# Patient Record
Sex: Female | Born: 1990 | Race: Black or African American | Hispanic: No | Marital: Single | Smoking: Current every day smoker
Health system: Southern US, Community
[De-identification: ages and names within clinical notes are randomized; demographics above are authoritative.]

## PROBLEM LIST (undated history)

## (undated) ENCOUNTER — Inpatient Hospital Stay (HOSPITAL_COMMUNITY): Payer: Self-pay

## (undated) DIAGNOSIS — Z789 Other specified health status: Secondary | ICD-10-CM

## (undated) HISTORY — PX: DILATION AND CURETTAGE OF UTERUS: SHX78

---
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
## 2012-04-22 ENCOUNTER — Encounter (HOSPITAL_COMMUNITY): Payer: Self-pay | Admitting: Emergency Medicine

## 2012-04-22 ENCOUNTER — Emergency Department (INDEPENDENT_AMBULATORY_CARE_PROVIDER_SITE_OTHER): Payer: Self-pay

## 2012-04-22 ENCOUNTER — Emergency Department (INDEPENDENT_AMBULATORY_CARE_PROVIDER_SITE_OTHER)
Admission: EM | Admit: 2012-04-22 | Discharge: 2012-04-22 | Disposition: A | Discharge status: Home / Self Care | Payer: Self-pay | Source: Home / Self Care

## 2012-04-22 DIAGNOSIS — S9030XA Contusion of unspecified foot, initial encounter: Secondary | ICD-10-CM

## 2012-04-22 MED ORDER — IBUPROFEN 800 MG PO TABS: 800.0000 mg | ORAL_TABLET | Freq: Three times a day (TID) | ORAL | Status: DC

## 2012-04-22 NOTE — ED Notes (Signed)
Pt states that she injured her left foot yesterday evening by kicking the tire of a car. Pt states that pain has gradually gotten worse and is most painful when trying to walk.  Some swelling noted. Pt has not tried any meds/ice for relief.

## 2012-04-22 NOTE — ED Provider Notes (Signed)
History     CSN: [id number]  Arrival date & time 04/22/12  1218   First MD Initiated Contact with Patient 04/22/12 1335      Chief Complaint  Patient presents with  . Foot Injury    injury to left foot.     (Consider location/radiation/quality/duration/timing/severity/associated sxs/prior treatment) Patient is a 22 y.o. female presenting with foot injury. The history is provided by the patient. No language interpreter was used.  Foot Injury Location:  Foot Time since incident:  1 day Foot location:  L foot Pain details:    Quality:  Aching   Radiates to:  Does not radiate   Severity:  Moderate   Onset quality:  Gradual   Timing:  Constant Chronicity:  New Worsened by:  Activity and bearing weight Associated symptoms: decreased ROM   Pt kicked a tire yesterday.   Pt complains of swelling and pain  History reviewed. No pertinent past medical history.  History reviewed. No pertinent past surgical history.  History reviewed. No pertinent family history.  History  Substance Use Topics  . Smoking status: Current Every Day Smoker -- 0.50 packs/day    Types: Cigarettes  . Smokeless tobacco: Not on file  . Alcohol Use: Yes    OB History   Grav Para Term Preterm Abortions TAB SAB Ect Mult Living                  Review of Systems  Musculoskeletal: Positive for myalgias and joint swelling.  All other systems reviewed and are negative.    Allergies  Review of patient's allergies indicates no known allergies.  Home Medications  No current outpatient prescriptions on file.  BP 113/68  Pulse 64  Temp(Src) 99 F (37.2 C) (Oral)  Resp 20  SpO2 100%  LMP 03/25/2012  Physical Exam  Nursing note and vitals reviewed. Constitutional: She appears well-developed and well-nourished.  HENT:  Head: Normocephalic.  Musculoskeletal: She exhibits tenderness.  Swollen tender left mid foot,  From,  nv and ns intact  Neurological: She is alert.  Skin: Skin is warm.   Psychiatric: She has a normal mood and affect.    ED Course  Procedures (including critical care time)  Labs Reviewed  POCT PREGNANCY, URINE   Dg Foot Complete Left  04/22/2012  *RADIOLOGY REPORT*  Clinical Data: Injury 1 day ago.  Pain.  LEFT FOOT - COMPLETE 3+ VIEW  Comparison: None.  Findings: Imaged bones, joints and soft tissues appear normal.  IMPRESSION: Negative exam.   Original Report Authenticated By: Holley Dexter, M.D.      No diagnosis found.    MDM  Pt advised post op shoe,  Ice, rest,  Elevate.   Pt given ibuprofen for pain.          Lonia Skinner [place], PA-C 04/22/12 1454

## 2013-07-28 ENCOUNTER — Encounter (HOSPITAL_COMMUNITY): Payer: Self-pay | Admitting: Emergency Medicine

## 2013-07-28 ENCOUNTER — Emergency Department (HOSPITAL_COMMUNITY)
Admission: EM | Admit: 2013-07-28 | Discharge: 2013-07-28 | Disposition: A | Discharge status: Home / Self Care | Payer: Self-pay | Attending: Emergency Medicine | Admitting: Emergency Medicine

## 2013-07-28 DIAGNOSIS — Z3202 Encounter for pregnancy test, result negative: Secondary | ICD-10-CM | POA: Insufficient documentation

## 2013-07-28 DIAGNOSIS — R109 Unspecified abdominal pain: Secondary | ICD-10-CM | POA: Insufficient documentation

## 2013-07-28 DIAGNOSIS — F17200 Nicotine dependence, unspecified, uncomplicated: Secondary | ICD-10-CM | POA: Insufficient documentation

## 2013-07-28 DIAGNOSIS — R112 Nausea with vomiting, unspecified: Secondary | ICD-10-CM | POA: Insufficient documentation

## 2013-07-28 DIAGNOSIS — Z791 Long term (current) use of non-steroidal anti-inflammatories (NSAID): Secondary | ICD-10-CM | POA: Insufficient documentation

## 2013-07-28 DIAGNOSIS — R197 Diarrhea, unspecified: Secondary | ICD-10-CM | POA: Insufficient documentation

## 2013-07-28 LAB — CBC WITH DIFFERENTIAL/PLATELET
Basophils Absolute: 0 10*3/uL (ref 0.0–0.1)
Basophils Relative: 1 % (ref 0–1)
Eosinophils Absolute: 0.1 10*3/uL (ref 0.0–0.7)
Eosinophils Relative: 2 % (ref 0–5)
HCT: 42.4 % (ref 36.0–46.0)
Hemoglobin: 14.1 g/dL (ref 12.0–15.0)
LYMPHS ABS: 1.7 10*3/uL (ref 0.7–4.0)
LYMPHS PCT: 43 % (ref 12–46)
MCH: 30.6 pg (ref 26.0–34.0)
MCHC: 33.3 g/dL (ref 30.0–36.0)
MCV: 92 fL (ref 78.0–100.0)
Monocytes Absolute: 0.4 10*3/uL (ref 0.1–1.0)
Monocytes Relative: 10 % (ref 3–12)
NEUTROS ABS: 1.8 10*3/uL (ref 1.7–7.7)
NEUTROS PCT: 44 % (ref 43–77)
PLATELETS: 247 10*3/uL (ref 150–400)
RBC: 4.61 MIL/uL (ref 3.87–5.11)
RDW: 13.3 % (ref 11.5–15.5)
WBC: 4 10*3/uL (ref 4.0–10.5)

## 2013-07-28 LAB — COMPREHENSIVE METABOLIC PANEL
ALK PHOS: 66 U/L (ref 39–117)
ALT: 13 U/L (ref 0–35)
AST: 28 U/L (ref 0–37)
Albumin: 3.7 g/dL (ref 3.5–5.2)
BUN: 10 mg/dL (ref 6–23)
CO2: 25 meq/L (ref 19–32)
Calcium: 8.7 mg/dL (ref 8.4–10.5)
Chloride: 101 mEq/L (ref 96–112)
Creatinine, Ser: 1.17 mg/dL — ABNORMAL HIGH (ref 0.50–1.10)
GFR, EST AFRICAN AMERICAN: 74 mL/min — AB (ref >90)
GFR, EST NON AFRICAN AMERICAN: 64 mL/min — AB (ref >90)
GLUCOSE: 129 mg/dL — AB (ref 70–99)
POTASSIUM: 3.9 meq/L (ref 3.7–5.3)
SODIUM: 139 meq/L (ref 137–147)
Total Bilirubin: 0.4 mg/dL (ref 0.3–1.2)
Total Protein: 7.5 g/dL (ref 6.0–8.3)

## 2013-07-28 LAB — URINALYSIS, ROUTINE W REFLEX MICROSCOPIC
Bilirubin Urine: NEGATIVE
GLUCOSE, UA: NEGATIVE mg/dL
HGB URINE DIPSTICK: NEGATIVE
KETONES UR: NEGATIVE mg/dL
Leukocytes, UA: NEGATIVE
Nitrite: NEGATIVE
PH: 6 (ref 5.0–8.0)
PROTEIN: NEGATIVE mg/dL
Specific Gravity, Urine: 1.028 (ref 1.005–1.030)
Urobilinogen, UA: 1 mg/dL (ref 0.0–1.0)

## 2013-07-28 LAB — POC URINE PREG, ED: Preg Test, Ur: NEGATIVE

## 2013-07-28 LAB — LIPASE, BLOOD: Lipase: 31 U/L (ref 11–59)

## 2013-07-28 MED ORDER — SODIUM CHLORIDE 0.9 % IV BOLUS (SEPSIS)
1000.0000 mL | Freq: Once | INTRAVENOUS | Status: AC
  Administered 2013-07-28: 1000 mL via INTRAVENOUS

## 2013-07-28 MED ORDER — ONDANSETRON HCL 4 MG/2ML IJ SOLN
4.0000 mg | Freq: Once | INTRAMUSCULAR | Status: AC
  Administered 2013-07-28: 4 mg via INTRAVENOUS
  Filled 2013-07-28: qty 2

## 2013-07-28 MED ORDER — PROMETHAZINE HCL 25 MG PO TABS: 25.0000 mg | ORAL_TABLET | Freq: Four times a day (QID) | ORAL | Status: DC | PRN

## 2013-07-28 NOTE — ED Notes (Signed)
Patient declined wheelchair at discharge.  RN escorted patient to lobby.  

## 2013-07-28 NOTE — ED Provider Notes (Signed)
CSN: [id number]     Arrival date & time 07/28/13  1634 History   First MD Initiated Contact with Patient 07/28/13 2007     Chief Complaint  Patient presents with  . Emesis  . Diarrhea   HPI  History provided by the patient. Patient is a 23 year old female with no significant PMH presenting with complaints of nausea, vomiting and diarrhea. Patient first began feeling slightly nauseated with some vomiting last night. This continued with a least 5 episodes of vomiting today. She also reports loose watery diarrhea throughout the day without blood or mucus. She had no denies any recent travel. No known sick contacts. No associated fever, chills or sweats. She did not use any medicines for the treatment. No other aggravating or alleviating factors. No other associated symptoms.    History reviewed. No pertinent past medical history. History reviewed. No pertinent past surgical history. No family history on file. History  Substance Use Topics  . Smoking status: Current Every Day Smoker -- 0.50 packs/day    Types: Cigarettes  . Smokeless tobacco: Not on file  . Alcohol Use: Yes   OB History   Grav Para Term Preterm Abortions TAB SAB Ect Mult Living                 Review of Systems  Constitutional: Negative for fever, chills and diaphoresis.  Gastrointestinal: Positive for nausea, vomiting, abdominal pain and diarrhea. Negative for blood in stool.  Genitourinary: Negative for dysuria, frequency, hematuria and menstrual problem.  All other systems reviewed and are negative.     Allergies  Review of patient's allergies indicates no known allergies.  Home Medications   Prior to Admission medications   Medication Sig Start Date End Date Taking? Authorizing Provider  ibuprofen (ADVIL,MOTRIN) 800 MG tablet Take 1 tablet (800 mg total) by mouth 3 (three) times daily. 04/22/12   Elson AreasLeslie K Sofia, PA-C   BP 105/59  Pulse 64  Temp(Src) 98.4 F (36.9 C) (Oral)  Resp 16  SpO2 100%  LMP  06/28/2013 Physical Exam  Nursing note and vitals reviewed. Constitutional: She is oriented to person, place, and time. She appears well-developed and well-nourished. No distress.  HENT:  Head: Normocephalic and atraumatic.  Mouth/Throat: Oropharynx is clear and moist.  Cardiovascular: Normal rate and regular rhythm.   Pulmonary/Chest: Effort normal and breath sounds normal. No respiratory distress. She has no wheezes.  Abdominal: Soft. She exhibits no distension. There is no tenderness. There is no rebound and no guarding.  Musculoskeletal: Normal range of motion.  Neurological: She is alert and oriented to person, place, and time.  Skin: Skin is warm and dry. No rash noted.  Psychiatric: She has a normal mood and affect. Her behavior is normal.    ED Course  Procedures   COORDINATION OF CARE:  Nursing notes reviewed. Vital signs reviewed. Initial pt interview and examination performed.   Filed Vitals:   07/28/13 1708 07/28/13 1950 07/28/13 1951  BP: 109/81 105/59   Pulse: 74  64  Temp: 98.4 F (36.9 C)    TempSrc: Oral    Resp: 16    SpO2: 99%  100%    8:16 PM-patient seen and evaluated. Patient appears well in no acute distress. Does not appear severely ill or toxic.  Patient feeling better after IV fluids and Zofran. She is requesting to be discharged at this time. She is tolerating by mouth fluids.  Treatment plan initiated:Medications - No data to display   Results for orders  placed during the hospital encounter of 07/28/13  CBC WITH DIFFERENTIAL      Result Value Ref Range   WBC 4.0  4.0 - 10.5 K/uL   RBC 4.61  3.87 - 5.11 MIL/uL   Hemoglobin 14.1  12.0 - 15.0 g/dL   HCT 82.942.4  56.236.0 - 13.046.0 %   MCV 92.0  78.0 - 100.0 fL   MCH 30.6  26.0 - 34.0 pg   MCHC 33.3  30.0 - 36.0 g/dL   RDW 86.513.3  78.411.5 - 69.615.5 %   Platelets 247  150 - 400 K/uL   Neutrophils Relative % 44  43 - 77 %   Neutro Abs 1.8  1.7 - 7.7 K/uL   Lymphocytes Relative 43  12 - 46 %   Lymphs Abs 1.7   0.7 - 4.0 K/uL   Monocytes Relative 10  3 - 12 %   Monocytes Absolute 0.4  0.1 - 1.0 K/uL   Eosinophils Relative 2  0 - 5 %   Eosinophils Absolute 0.1  0.0 - 0.7 K/uL   Basophils Relative 1  0 - 1 %   Basophils Absolute 0.0  0.0 - 0.1 K/uL  COMPREHENSIVE METABOLIC PANEL      Result Value Ref Range   Sodium 139  137 - 147 mEq/L   Potassium 3.9  3.7 - 5.3 mEq/L   Chloride 101  96 - 112 mEq/L   CO2 25  19 - 32 mEq/L   Glucose, Bld 129 (*) 70 - 99 mg/dL   BUN 10  6 - 23 mg/dL   Creatinine, Ser 2.951.17 (*) 0.50 - 1.10 mg/dL   Calcium 8.7  8.4 - 28.410.5 mg/dL   Total Protein 7.5  6.0 - 8.3 g/dL   Albumin 3.7  3.5 - 5.2 g/dL   AST 28  0 - 37 U/L   ALT 13  0 - 35 U/L   Alkaline Phosphatase 66  39 - 117 U/L   Total Bilirubin 0.4  0.3 - 1.2 mg/dL   GFR calc non Af Amer 64 (*) >90 mL/min   GFR calc Af Amer 74 (*) >90 mL/min  LIPASE, BLOOD      Result Value Ref Range   Lipase 31  11 - 59 U/L  URINALYSIS, ROUTINE W REFLEX MICROSCOPIC      Result Value Ref Range   Color, Urine YELLOW  YELLOW   APPearance HAZY (*) CLEAR   Specific Gravity, Urine 1.028  1.005 - 1.030   pH 6.0  5.0 - 8.0   Glucose, UA NEGATIVE  NEGATIVE mg/dL   Hgb urine dipstick NEGATIVE  NEGATIVE   Bilirubin Urine NEGATIVE  NEGATIVE   Ketones, ur NEGATIVE  NEGATIVE mg/dL   Protein, ur NEGATIVE  NEGATIVE mg/dL   Urobilinogen, UA 1.0  0.0 - 1.0 mg/dL   Nitrite NEGATIVE  NEGATIVE   Leukocytes, UA NEGATIVE  NEGATIVE  POC URINE PREG, ED      Result Value Ref Range   Preg Test, Ur NEGATIVE  NEGATIVE      MDM   Final diagnoses:  Nausea vomiting and diarrhea        Angus Sellereter S Katessa Attridge, PA-C 07/28/13 2118

## 2013-07-28 NOTE — ED Notes (Signed)
C/o generalized abd pain since last night with nausea, vomiting, and diarrhea.  Reports vomited x 5 today.

## 2013-07-28 NOTE — Discharge Instructions (Signed)
Please followup with a primary care provider for continued evaluation and treatment. Drink plenty of fluids to stay hydrated. Wash your hands frequently to prevent any spread of infection. Return for any changing or worsening symptoms.    Viral Gastroenteritis Viral gastroenteritis is also known as stomach flu. This condition affects the stomach and intestinal tract. It can cause sudden diarrhea and vomiting. The illness typically lasts 3 to 8 days. Most people develop an immune response that eventually gets rid of the virus. While this natural response develops, the virus can make you quite ill. CAUSES  Many different viruses can cause gastroenteritis, such as rotavirus or noroviruses. You can catch one of these viruses by consuming contaminated food or water. You may also catch a virus by sharing utensils or other personal items with an infected person or by touching a contaminated surface. SYMPTOMS  The most common symptoms are diarrhea and vomiting. These problems can cause a severe loss of body fluids (dehydration) and a body salt (electrolyte) imbalance. Other symptoms may include:  Fever.  Headache.  Fatigue.  Abdominal pain. DIAGNOSIS  Your caregiver can usually diagnose viral gastroenteritis based on your symptoms and a physical exam. A stool sample may also be taken to test for the presence of viruses or other infections. TREATMENT  This illness typically goes away on its own. Treatments are aimed at rehydration. The most serious cases of viral gastroenteritis involve vomiting so severely that you are not able to keep fluids down. In these cases, fluids must be given through an intravenous line (IV). HOME CARE INSTRUCTIONS   Drink enough fluids to keep your urine clear or pale yellow. Drink small amounts of fluids frequently and increase the amounts as tolerated.  Ask your caregiver for specific rehydration instructions.  Avoid:  Foods high in sugar.  Alcohol.  Carbonated  drinks.  Tobacco.  Juice.  Caffeine drinks.  Extremely hot or cold fluids.  Fatty, greasy foods.  Too much intake of anything at one time.  Dairy products until 24 to 48 hours after diarrhea stops.  You may consume probiotics. Probiotics are active cultures of beneficial bacteria. They may lessen the amount and number of diarrheal stools in adults. Probiotics can be found in yogurt with active cultures and in supplements.  Wash your hands well to avoid spreading the virus.  Only take over-the-counter or prescription medicines for pain, discomfort, or fever as directed by your caregiver. Do not give aspirin to children. Antidiarrheal medicines are not recommended.  Ask your caregiver if you should continue to take your regular prescribed and over-the-counter medicines.  Keep all follow-up appointments as directed by your caregiver. SEEK IMMEDIATE MEDICAL CARE IF:   You are unable to keep fluids down.  You do not urinate at least once every 6 to 8 hours.  You develop shortness of breath.  You notice blood in your stool or vomit. This may look like coffee grounds.  You have abdominal pain that increases or is concentrated in one small area (localized).  You have persistent vomiting or diarrhea.  You have a fever.  The patient is a child younger than 3 months, and he or she has a fever.  The patient is a child older than 3 months, and he or she has a fever and persistent symptoms.  The patient is a child older than 3 months, and he or she has a fever and symptoms suddenly get worse.  The patient is a baby, and he or she has no tears  when crying. MAKE SURE YOU:   Understand these instructions.  Will watch your condition.  Will get help right away if you are not doing well or get worse. Document Released: 01/31/2005 Document Revised: 04/25/2011 Document Reviewed: 11/17/2010 [facility] Patient Information 2014 [employer].

## 2013-07-29 NOTE — ED Provider Notes (Signed)
Medical screening examination/treatment/procedure(s) were performed by non-physician practitioner and as supervising physician I was immediately available for consultation/collaboration.   EKG Interpretation None        Dreanna Kyllo M Chrishawn Boley, MD 07/29/13 0028 

## 2014-02-04 ENCOUNTER — Other Ambulatory Visit (HOSPITAL_COMMUNITY): Payer: Self-pay | Admitting: Nurse Practitioner

## 2014-02-04 DIAGNOSIS — Z369 Encounter for antenatal screening, unspecified: Secondary | ICD-10-CM

## 2014-02-04 LAB — OB RESULTS CONSOLE ABO/RH: RH TYPE: POSITIVE

## 2014-02-04 LAB — GLUCOSE TOLERANCE, 1 HOUR (50G) W/O FASTING: GLUCOSE 1 HOUR GTT: 112

## 2014-02-04 LAB — OB RESULTS CONSOLE GC/CHLAMYDIA
CHLAMYDIA, DNA PROBE: NEGATIVE
GC PROBE AMP, GENITAL: NEGATIVE

## 2014-02-04 LAB — OB RESULTS CONSOLE PLATELET COUNT: PLATELETS: 253 10*3/uL

## 2014-02-04 LAB — OB RESULTS CONSOLE RPR: RPR: NONREACTIVE

## 2014-02-04 LAB — SICKLE CELL SCREEN: Sickle Cell Screen: NEGATIVE

## 2014-02-04 LAB — CULTURE, OB URINE
PAP SMEAR: NEGATIVE
URINE CULTURE, OB: NEGATIVE

## 2014-02-04 LAB — OB RESULTS CONSOLE HGB/HCT, BLOOD
HEMATOCRIT: 34 %
Hemoglobin: 11 g/dL

## 2014-02-04 LAB — CYTOLOGY - PAP: CYTOLOGY - PAP: NEGATIVE

## 2014-02-04 LAB — CYSTIC FIBROSIS DIAGNOSTIC STUDY: INTERPRETATION-CFDNA: NEGATIVE

## 2014-02-04 LAB — OB RESULTS CONSOLE VARICELLA ZOSTER ANTIBODY, IGG: VARICELLA IGG: IMMUNE

## 2014-02-04 LAB — OB RESULTS CONSOLE ANTIBODY SCREEN: ANTIBODY SCREEN: NEGATIVE

## 2014-02-04 LAB — DRUG SCREEN, URINE: Drug Screen, Urine: POSITIVE

## 2014-02-11 ENCOUNTER — Encounter (HOSPITAL_COMMUNITY): Payer: Self-pay

## 2014-02-11 ENCOUNTER — Ambulatory Visit (HOSPITAL_COMMUNITY)
Admission: RE | Admit: 2014-02-11 | Discharge: 2014-02-11 | Disposition: A | Discharge status: Home / Self Care | Payer: Medicaid Other | Source: Ambulatory Visit | Attending: Nurse Practitioner | Admitting: Nurse Practitioner

## 2014-02-11 ENCOUNTER — Other Ambulatory Visit (HOSPITAL_COMMUNITY): Payer: Medicaid Other | Admitting: Nurse Practitioner

## 2014-02-11 ENCOUNTER — Other Ambulatory Visit (HOSPITAL_COMMUNITY): Payer: Self-pay | Admitting: Nurse Practitioner

## 2014-02-11 VITALS — BP 111/57 | HR 61 | Wt 156.0 lb

## 2014-02-11 DIAGNOSIS — Z36 Encounter for antenatal screening of mother: Secondary | ICD-10-CM | POA: Diagnosis present

## 2014-02-11 DIAGNOSIS — Z3A11 11 weeks gestation of pregnancy: Secondary | ICD-10-CM | POA: Diagnosis not present

## 2014-02-11 DIAGNOSIS — Z369 Encounter for antenatal screening, unspecified: Secondary | ICD-10-CM

## 2014-02-11 DIAGNOSIS — O09211 Supervision of pregnancy with history of pre-term labor, first trimester: Secondary | ICD-10-CM | POA: Insufficient documentation

## 2014-02-11 DIAGNOSIS — Z3490 Encounter for supervision of normal pregnancy, unspecified, unspecified trimester: Secondary | ICD-10-CM | POA: Insufficient documentation

## 2014-02-11 HISTORY — DX: Other specified health status: Z78.9

## 2014-02-11 LAB — OB RESULTS CONSOLE HEPATITIS B SURFACE ANTIGEN: Hepatitis B Surface Ag: NEGATIVE

## 2014-02-11 LAB — OB RESULTS CONSOLE HIV ANTIBODY (ROUTINE TESTING): HIV: NONREACTIVE

## 2014-02-11 LAB — OB RESULTS CONSOLE RUBELLA ANTIBODY, IGM: Rubella: IMMUNE

## 2014-02-13 ENCOUNTER — Ambulatory Visit (INDEPENDENT_AMBULATORY_CARE_PROVIDER_SITE_OTHER): Payer: Medicaid Other | Admitting: Family

## 2014-02-13 ENCOUNTER — Encounter: Payer: Self-pay | Admitting: Family

## 2014-02-13 VITALS — BP 108/65 | HR 69 | Temp 98.6°F | Ht 61.5 in | Wt 155.7 lb

## 2014-02-13 DIAGNOSIS — O09891 Supervision of other high risk pregnancies, first trimester: Secondary | ICD-10-CM | POA: Insufficient documentation

## 2014-02-13 DIAGNOSIS — Z23 Encounter for immunization: Secondary | ICD-10-CM

## 2014-02-13 DIAGNOSIS — O09291 Supervision of pregnancy with other poor reproductive or obstetric history, first trimester: Secondary | ICD-10-CM

## 2014-02-13 DIAGNOSIS — O34219 Maternal care for unspecified type scar from previous cesarean delivery: Secondary | ICD-10-CM

## 2014-02-13 DIAGNOSIS — F1290 Cannabis use, unspecified, uncomplicated: Secondary | ICD-10-CM

## 2014-02-13 DIAGNOSIS — F17210 Nicotine dependence, cigarettes, uncomplicated: Secondary | ICD-10-CM

## 2014-02-13 DIAGNOSIS — O3421 Maternal care for scar from previous cesarean delivery: Secondary | ICD-10-CM

## 2014-02-13 DIAGNOSIS — F1210 Cannabis abuse, uncomplicated: Secondary | ICD-10-CM

## 2014-02-13 DIAGNOSIS — O09211 Supervision of pregnancy with history of pre-term labor, first trimester: Secondary | ICD-10-CM

## 2014-02-13 DIAGNOSIS — Z720 Tobacco use: Secondary | ICD-10-CM

## 2014-02-13 DIAGNOSIS — O09299 Supervision of pregnancy with other poor reproductive or obstetric history, unspecified trimester: Secondary | ICD-10-CM | POA: Insufficient documentation

## 2014-02-13 LAB — POCT URINALYSIS DIP (DEVICE)
BILIRUBIN URINE: NEGATIVE
GLUCOSE, UA: NEGATIVE mg/dL
Hgb urine dipstick: NEGATIVE
Ketones, ur: NEGATIVE mg/dL
LEUKOCYTES UA: NEGATIVE
NITRITE: NEGATIVE
Protein, ur: NEGATIVE mg/dL
Specific Gravity, Urine: 1.015 (ref 1.005–1.030)
UROBILINOGEN UA: 0.2 mg/dL (ref 0.0–1.0)
pH: 7.5 (ref 5.0–8.0)

## 2014-02-13 NOTE — Patient Instructions (Signed)
First Trimester of Pregnancy The first trimester of pregnancy is from week 1 until the end of week 12 (months 1 through 3). A week after a sperm fertilizes an egg, the egg will implant on the wall of the uterus. This embryo will begin to develop into a baby. Genes from you and your partner are forming the baby. The female genes determine whether the baby is a boy or a girl. At 6-8 weeks, the eyes and face are formed, and the heartbeat can be seen on ultrasound. At the end of 12 weeks, all the baby's organs are formed.  Now that you are pregnant, you will want to do everything you can to have a healthy baby. Two of the most important things are to get good prenatal care and to follow your health care provider's instructions. Prenatal care is all the medical care you receive before the baby's birth. This care will help prevent, find, and treat any problems during the pregnancy and childbirth. BODY CHANGES Your body goes through many changes during pregnancy. The changes vary from woman to woman.   You may gain or lose a couple of pounds at first.  You may feel sick to your stomach (nauseous) and throw up (vomit). If the vomiting is uncontrollable, call your health care provider.  You may tire easily.  You may develop headaches that can be relieved by medicines approved by your health care provider.  You may urinate more often. Painful urination may mean you have a bladder infection.  You may develop heartburn as a result of your pregnancy.  You may develop constipation because certain hormones are causing the muscles that push waste through your intestines to slow down.  You may develop hemorrhoids or swollen, bulging veins (varicose veins).  Your breasts may begin to grow larger and become tender. Your nipples may stick out more, and the tissue that surrounds them (areola) may become darker.  Your gums may bleed and may be sensitive to brushing and flossing.  Dark spots or blotches (chloasma,  mask of pregnancy) may develop on your face. This will likely fade after the baby is born.  Your menstrual periods will stop.  You may have a loss of appetite.  You may develop cravings for certain kinds of food.  You may have changes in your emotions from day to day, such as being excited to be pregnant or being concerned that something may go wrong with the pregnancy and baby.  You may have more vivid and strange dreams.  You may have changes in your hair. These can include thickening of your hair, rapid growth, and changes in texture. Some women also have hair loss during or after pregnancy, or hair that feels dry or thin. Your hair will most likely return to normal after your baby is born. WHAT TO EXPECT AT YOUR PRENATAL VISITS During a routine prenatal visit:  You will be weighed to make sure you and the baby are growing normally.  Your blood pressure will be taken.  Your abdomen will be measured to track your baby's growth.  The fetal heartbeat will be listened to starting around week 10 or 12 of your pregnancy.  Test results from any previous visits will be discussed. Your health care provider may ask you:  How you are feeling.  If you are feeling the baby move.  If you have had any abnormal symptoms, such as leaking fluid, bleeding, severe headaches, or abdominal cramping.  If you have any questions. Other tests   that may be performed during your first trimester include:  Blood tests to find your blood type and to check for the presence of any previous infections. They will also be used to check for low iron levels (anemia) and Rh antibodies. Later in the pregnancy, blood tests for diabetes will be done along with other tests if problems develop.  Urine tests to check for infections, diabetes, or protein in the urine.  An ultrasound to confirm the proper growth and development of the baby.  An amniocentesis to check for possible genetic problems.  Fetal screens for  spina bifida and Down syndrome.  You may need other tests to make sure you and the baby are doing well. HOME CARE INSTRUCTIONS  Medicines  Follow your health care provider's instructions regarding medicine use. Specific medicines may be either safe or unsafe to take during pregnancy.  Take your prenatal vitamins as directed.  If you develop constipation, try taking a stool softener if your health care provider approves. Diet  Eat regular, well-balanced meals. Choose a variety of foods, such as meat or vegetable-based protein, fish, milk and low-fat dairy products, vegetables, fruits, and whole grain breads and cereals. Your health care provider will help you determine the amount of weight gain that is right for you.  Avoid raw meat and uncooked cheese. These carry germs that can cause birth defects in the baby.  Eating four or five small meals rather than three large meals a day may help relieve nausea and vomiting. If you start to feel nauseous, eating a few soda crackers can be helpful. Drinking liquids between meals instead of during meals also seems to help nausea and vomiting.  If you develop constipation, eat more high-fiber foods, such as fresh vegetables or fruit and whole grains. Drink enough fluids to keep your urine clear or pale yellow. Activity and Exercise  Exercise only as directed by your health care provider. Exercising will help you:  Control your weight.  Stay in shape.  Be prepared for labor and delivery.  Experiencing pain or cramping in the lower abdomen or low back is a good sign that you should stop exercising. Check with your health care provider before continuing normal exercises.  Try to avoid standing for long periods of time. Move your legs often if you must stand in one place for a long time.  Avoid heavy lifting.  Wear low-heeled shoes, and practice good posture.  You may continue to have sex unless your health care provider directs you  otherwise. Relief of Pain or Discomfort  Wear a good support bra for breast tenderness.   Take warm sitz baths to soothe any pain or discomfort caused by hemorrhoids. Use hemorrhoid cream if your health care provider approves.   Rest with your legs elevated if you have leg cramps or low back pain.  If you develop varicose veins in your legs, wear support hose. Elevate your feet for 15 minutes, 3-4 times a day. Limit salt in your diet. Prenatal Care  Schedule your prenatal visits by the twelfth week of pregnancy. They are usually scheduled monthly at first, then more often in the last 2 months before delivery.  Write down your questions. Take them to your prenatal visits.  Keep all your prenatal visits as directed by your health care provider. Safety  Wear your seat belt at all times when driving.  Make a list of emergency phone numbers, including numbers for family, friends, the hospital, and police and fire departments. General Tips    Ask your health care provider for a referral to a local prenatal education class. Begin classes no later than at the beginning of month 6 of your pregnancy.  Ask for help if you have counseling or nutritional needs during pregnancy. Your health care provider can offer advice or refer you to specialists for help with various needs.  Do not use hot tubs, steam rooms, or saunas.  Do not douche or use tampons or scented sanitary pads.  Do not cross your legs for long periods of time.  Avoid cat litter boxes and soil used by cats. These carry germs that can cause birth defects in the baby and possibly loss of the fetus by miscarriage or stillbirth.  Avoid all smoking, herbs, alcohol, and medicines not prescribed by your health care provider. Chemicals in these affect the formation and growth of the baby.  Schedule a dentist appointment. At home, brush your teeth with a soft toothbrush and be gentle when you floss. SEEK MEDICAL CARE IF:   You have  dizziness.  You have mild pelvic cramps, pelvic pressure, or nagging pain in the abdominal area.  You have persistent nausea, vomiting, or diarrhea.  You have a bad smelling vaginal discharge.  You have pain with urination.  You notice increased swelling in your face, hands, legs, or ankles. SEEK IMMEDIATE MEDICAL CARE IF:   You have a fever.  You are leaking fluid from your vagina.  You have spotting or bleeding from your vagina.  You have severe abdominal cramping or pain.  You have rapid weight gain or loss.  You vomit blood or material that looks like coffee grounds.  You are exposed to German measles and have never had them.  [place] are exposed to fifth disease or chickenpox.  You develop a severe headache.  You have shortness of breath.  You have any kind of trauma, such as from a fall or a car accident. Document Released: 01/25/2001 Document Revised: 06/17/2013 Document Reviewed: 12/11/2012 [facility] Patient Information 2015 [place], [place]. This information is not intended to replace advice given to you by your health care provider. Make sure you discuss any questions you have with your health care provider. Vaginal Birth After Cesarean Delivery Vaginal birth after cesarean delivery (VBAC) is giving birth vaginally after previously delivering a baby by a cesarean. In the past, if a woman had a cesarean delivery, all births afterward would be done by cesarean delivery. This is no longer true. It can be safe for the mother to try a vaginal delivery after having a cesarean delivery.  It is important to discuss VBAC with your health care provider early in the pregnancy so you can understand the risks, benefits, and options. It will give you time to decide what is best in your particular case. The final decision about whether to have a VBAC or repeat cesarean delivery should be between you and your health care provider. Any changes in your health or your baby's health during your  pregnancy may make it necessary to change your initial decision about VBAC.  WOMEN WHO PLAN TO HAVE A VBAC SHOULD CHECK WITH THEIR HEALTH CARE PROVIDER TO BE SURE THAT:  The previous cesarean delivery was done with a low transverse uterine cut (incision) (not a vertical classical incision).   The birth canal is big enough for the baby.   There were no other operations on the uterus.   An electronic fetal monitor (EFM) will be on at all times during labor.   An  operating room will be available and ready in case an emergency cesarean delivery is needed.   A health care provider and surgical nursing staff will be available at all times during labor to be ready to do an emergency delivery cesarean if necessary.   An anesthesiologist will be present in case an emergency cesarean delivery is needed.   The nursery is prepared and has adequate personnel and necessary equipment available to care for the baby in case of an emergency cesarean delivery. BENEFITS OF VBAC  Shorter stay in the hospital.   Avoidance of risks associated with cesarean delivery, such as:  Surgical complications, such as opening of the incision or hernia in the incision.  Injury to other organs.  Fever. This can occur if an infection develops after surgery. It can also occur as a reaction to the medicine given to make you numb during the surgery.  Less blood loss and need for blood transfusions.  Lower risk of blood clots and infection.  Shorter recovery.   Decreased risk for having to remove the uterus (hysterectomy).   Decreased risk for the placenta to completely or partially cover the opening of the uterus (placenta previa) with a future pregnancy.   Decrease risk in future labor and delivery. RISKS OF A VBAC  Tearing (rupture) of the uterus. This is occurs in less than 1% of VBACs. The risk of this happening is higher if:  Steps are taken to begin the labor process (induce labor) or  stimulate or strengthen contractions (augment labor).   Medicine is used to soften (ripen) the cervix.  Having to remove the uterus (hysterectomy) if it ruptures. VBAC SHOULD NOT BE DONE IF:  The previous cesarean delivery was done with a vertical (classical) or T-shaped incision or you do not know what kind of incision was made.   You had a ruptured uterus.   You have had certain types of surgery on your uterus, such as removal of uterine fibroids. Ask your health care provider about other types of surgeries that prevent you from having a VBAC.  You have certain medical or childbirth (obstetrical) problems.   There are problems with the baby.   You have had two previous cesarean deliveries and no vaginal deliveries. OTHER FACTS TO KNOW ABOUT VBAC:  It is safe to have an epidural anesthetic with VBAC.   It is safe to turn the baby from a breech position (attempt an external cephalic version).   It is safe to try a VBAC with twins.   VBAC may not be successful if your baby weights 8.8 lb (4 kg) or more. However, weight predictions are not always accurate and should not be used alone to decide if VBAC is right for you.  There is an increased failure rate if the time between the cesarean delivery and VBAC is less than 19 months.   Your health care provider may advise against a VBAC if you have preeclampsia (high blood pressure, protein in the urine, and swelling of face and extremities).   VBAC is often successful if you previously gave birth vaginally.   VBAC is often successful when the labor starts spontaneously before the due date.   Delivering a baby through a VBAC is similar to having a normal spontaneous vaginal delivery. Document Released: 07/24/2006 Document Revised: 06/17/2013 Document Reviewed: 08/30/2012 [facility] Patient Information 2015 [place]Care, [place]LL[place]. This information is not intended to replace advice given to you by your health care provider. Make sure  you discuss any questions  you have with your health care provider.

## 2014-02-13 NOTE — Progress Notes (Signed)
   Subjective:    Tami NorlanderViola Brocksmith is a [id number] 6134w2d being seen today for her first obstetrical visit.  [facility] to obstetrical history significant for IUFD at 24.5 wks preeceded by PPROM and LTCS of term infant in 2010.  Patient uncertain  regarding breastfeeding. Pregnancy history fully reviewed.  Patient reports no complaints.  Filed Vitals:   02/13/14 0932 02/13/14 0938  BP: 108/65   Pulse: 69   Temp: 98.6 F (37 C)   Height:  5' 1.5" (1.562 m)  Weight: 155 lb 11.2 oz (70.625 kg)     HISTORY: OB History  Gravida Para Term Preterm AB SAB TAB Ectopic Multiple Living  3 2 1 1      2     # Outcome Date GA Lbr Len/2nd Weight Sex Delivery Anes PTL Lv  3 Current           2 Term 03/18/08 7458w6d  7 lb (3.175 kg) M CS-LTranv  N Y  1 Preterm 06/03/06 526w5d  1 lb 3 oz (0.539 kg) F Vag-Spont  Y FD     Complications: Preterm premature rupture of membranes (PPROM) delivered, current hospitalization     Past Medical History  Diagnosis Date  . Medical history non-contributory    Past Surgical History  Procedure Laterality Date  . Dilation and curettage of uterus    . Cesarean section     Family History  Problem Relation Age of Onset  . Hypertension Father   . COPD Father   . Heart disease Brother      Exam   See Records from HD   Assessment:    Pregnancy:   23 yo G3P1102 at 734w2d wks IUP  Patient Active Problem List   Diagnosis Date Noted  . History of preterm delivery, currently pregnant in first trimester 02/13/2014  . History of intrauterine fetal death, currently pregnant 02/13/2014  . Cigarette smoker 02/13/2014  . Marijuana use 02/13/2014  . History of cesarean delivery, currently pregnant 02/13/2014  . [redacted] weeks gestation of pregnancy   . Pregnancy at early stage         Plan:     Initial labs reviewed. Prenatal vitamins. Problem list reviewed and updated. Pt counseled regarding csection versus TOLAC > pt opts for repeat  csection. 17-p application completed. Genetic Screening discussed Integrated Screen: scheduled for 02/25/13. Follow up in 3 weeks.   Marlis EdelsonKARIM, Willard Madrigal N 02/13/2014

## 2014-02-13 NOTE — Progress Notes (Signed)
Here for initial prenatal record- transferring [facility]. New patient information given. C/o lower back pain.

## 2014-02-13 NOTE — Progress Notes (Signed)
[facility] paperwork completed and faxed

## 2014-02-13 NOTE — Progress Notes (Signed)
Nutrition note: 1st visit consult Pt has lost 0.3# @ 3264w2d. Pt reports she had a lot of nausea in the beginning but it has improved. Pt reports eating 3 meals & 2-4 snacks/d. Pt is taking a PNV. Pt reports no heartburn. Pt received verbal & written education on general nutrition during pregnancy. Encouraged protein with all meals & snacks. Discussed wt gain goals of 15-25# or 0.6#/wk. Pt agrees to continue taking her PNV. Pt does not have WIC but plans to apply. Pt does not plan to BF. F/u as needed Blondell RevealLaura Jay Haskew, MS, RD, LDN, [facility] HealthcareBCLC

## 2014-02-17 ENCOUNTER — Encounter: Payer: Self-pay | Admitting: *Deleted

## 2014-02-21 ENCOUNTER — Encounter: Payer: Self-pay | Admitting: *Deleted

## 2014-02-25 ENCOUNTER — Encounter (HOSPITAL_COMMUNITY): Payer: Self-pay

## 2014-02-25 ENCOUNTER — Ambulatory Visit (HOSPITAL_COMMUNITY)
Admission: RE | Admit: 2014-02-25 | Discharge: 2014-02-25 | Disposition: A | Discharge status: Home / Self Care | Payer: Medicaid Other | Source: Ambulatory Visit | Attending: Obstetrics & Gynecology | Admitting: Obstetrics & Gynecology

## 2014-02-25 DIAGNOSIS — Z3A13 13 weeks gestation of pregnancy: Secondary | ICD-10-CM | POA: Insufficient documentation

## 2014-02-25 DIAGNOSIS — Z369 Encounter for antenatal screening, unspecified: Secondary | ICD-10-CM

## 2014-02-25 DIAGNOSIS — Z36 Encounter for antenatal screening of mother: Secondary | ICD-10-CM | POA: Diagnosis present

## 2014-02-25 DIAGNOSIS — Z3682 Encounter for antenatal screening for nuchal translucency: Secondary | ICD-10-CM | POA: Insufficient documentation

## 2014-02-25 LAB — SCREEN, FIRST TRIMESTER, SERUM

## 2014-02-26 ENCOUNTER — Telehealth: Payer: Self-pay | Admitting: *Deleted

## 2014-02-26 ENCOUNTER — Encounter: Payer: Self-pay | Admitting: *Deleted

## 2014-02-26 NOTE — Telephone Encounter (Signed)
Pt called and stated that she is experiencing some light bleeding. She reports that she did have intercourse this morning. I advised that this is most likely due to the intercourse and that if bleeding became heavy or she had pain to call us back. Patient is agreeable to this.

## 2014-02-26 NOTE — Progress Notes (Signed)
[facility]  [facility] and will be triaged to the pharmacy when the patient is 15 w 0 days gestation.

## 2014-03-04 ENCOUNTER — Other Ambulatory Visit (HOSPITAL_COMMUNITY): Payer: Self-pay

## 2014-03-06 ENCOUNTER — Ambulatory Visit (INDEPENDENT_AMBULATORY_CARE_PROVIDER_SITE_OTHER): Payer: Medicaid Other | Admitting: Family Medicine

## 2014-03-06 ENCOUNTER — Encounter: Payer: Medicaid Other | Admitting: Family Medicine

## 2014-03-06 ENCOUNTER — Telehealth: Payer: Self-pay | Admitting: *Deleted

## 2014-03-06 VITALS — BP 108/52 | HR 78 | Temp 98.1°F | Wt 160.7 lb

## 2014-03-06 DIAGNOSIS — O09891 Supervision of other high risk pregnancies, first trimester: Secondary | ICD-10-CM

## 2014-03-06 DIAGNOSIS — O09211 Supervision of pregnancy with history of pre-term labor, first trimester: Secondary | ICD-10-CM

## 2014-03-06 LAB — POCT URINALYSIS DIP (DEVICE)
BILIRUBIN URINE: NEGATIVE
GLUCOSE, UA: NEGATIVE mg/dL
Hgb urine dipstick: NEGATIVE
Ketones, ur: NEGATIVE mg/dL
Nitrite: NEGATIVE
PH: 7 (ref 5.0–8.0)
Protein, ur: NEGATIVE mg/dL
Specific Gravity, Urine: 1.025 (ref 1.005–1.030)
UROBILINOGEN UA: 1 mg/dL (ref 0.0–1.0)

## 2014-03-06 NOTE — Addendum Note (Signed)
Addended by: Fredirick LatheACOSTA, Verle Brillhart on: 03/06/2014 11:55 AM   Modules accepted: Level of Service

## 2014-03-06 NOTE — Telephone Encounter (Signed)
[facility] that she doesn't need to come to her appointment today because she is too early to start the shot. I advised patient that the appointment today is for a prenatal visit. We will start her shot in 2 weeks. Patient is agreeable to this, stated that she will be here at 11.

## 2014-03-06 NOTE — Patient Instructions (Signed)
Vaginal Bleeding During Pregnancy, Second Trimester °A small amount of bleeding (spotting) from the vagina is relatively common in pregnancy. It usually stops on its own. Various things can cause bleeding or spotting in pregnancy. Some bleeding may be related to the pregnancy, and some may not. Sometimes the bleeding is normal and is not a problem. However, bleeding can also be a sign of something serious. Be sure to tell your health care provider about any vaginal bleeding right away. °Some possible causes of vaginal bleeding during the second trimester include: °· Infection, inflammation, or growths on the cervix.   °· The placenta may be partially or completely covering the opening of the cervix inside the uterus (placenta previa). °· The placenta may have separated from the uterus (abruption of the placenta).   °· You may be having early (preterm) labor.   °· The cervix may not be strong enough to keep a baby inside the uterus (cervical insufficiency).   °· Tiny cysts may have developed in the uterus instead of pregnancy tissue (molar pregnancy).  °HOME CARE INSTRUCTIONS  °Watch your condition for any changes. The following actions may help to lessen any discomfort you are feeling: °· Follow your health care provider's instructions for limiting your activity. If your health care provider orders bed rest, you may need to stay in bed and only get up to use the bathroom. However, your health care provider may allow you to continue light activity. °· If needed, make plans for someone to help with your regular activities and responsibilities while you are on bed rest. °· Keep track of the number of pads you use each day, how often you change pads, and how soaked (saturated) they are. Write this down. °· Do not use tampons. Do not douche. °· Do not have sexual intercourse or orgasms until approved by your health care provider. °· If you pass any tissue from your vagina, save the tissue so you can show it to your  health care provider. °· Only take over-the-counter or prescription medicines as directed by your health care provider. °· Do not take aspirin because it can make you bleed. °· Do not exercise or perform any strenuous activities or heavy lifting without your health care provider's permission. °· Keep all follow-up appointments as directed by your health care provider. °SEEK MEDICAL CARE IF: °· You have any vaginal bleeding during any part of your pregnancy. °· You have cramps or labor pains. °· You have a fever, not controlled by medicine. °SEEK IMMEDIATE MEDICAL CARE IF:  °· You have severe cramps in your back or belly (abdomen). °· You have contractions. °· You have chills. °· You pass large clots or tissue from your vagina. °· Your bleeding increases. °· You feel light-headed or weak, or you have fainting episodes. °· You are leaking fluid or have a gush of fluid from your vagina. °MAKE SURE YOU: °· Understand these instructions. °· Will watch your condition. °· Will get help right away if you are not doing well or get worse. °Document Released: 11/10/2004 Document Revised: 02/05/2013 Document Reviewed: 10/08/2012 °ExitCare® Patient Information ©2015 Exit[employer], LLC. This information is not intended to replace advice given to you by your health care provider. Make sure you discuss any questions you have with your health care provider. ° °

## 2014-03-06 NOTE — Progress Notes (Signed)
Patient is 24 y.o. [id number] 4465w2d.  Some back pain, worse at work or when has long day.  Had some vaginal spotting after intercourse. - discussed vaginal bleeding, precautions discussed - increase water intake, tylenol prn back pain. - 17p to be started at next visit, anatomy sono ordered

## 2014-03-11 ENCOUNTER — Encounter: Payer: Self-pay | Admitting: *Deleted

## 2014-03-20 ENCOUNTER — Ambulatory Visit (INDEPENDENT_AMBULATORY_CARE_PROVIDER_SITE_OTHER): Payer: Medicaid Other | Admitting: *Deleted

## 2014-03-20 VITALS — BP 108/65 | HR 79

## 2014-03-20 DIAGNOSIS — O09892 Supervision of other high risk pregnancies, second trimester: Secondary | ICD-10-CM

## 2014-03-20 DIAGNOSIS — O09212 Supervision of pregnancy with history of pre-term labor, second trimester: Secondary | ICD-10-CM

## 2014-03-20 MED ORDER — HYDROXYPROGESTERONE CAPROATE 250 MG/ML IM OIL
250.0000 mg | TOPICAL_OIL | Freq: Once | INTRAMUSCULAR | Status: AC
  Administered 2014-03-20: 250 mg via INTRAMUSCULAR

## 2014-03-20 NOTE — Progress Notes (Signed)
Pt here for first dose of 17P ([facility]).  Injection given IM to RUOQ.  Pt tolerated well.

## 2014-03-27 ENCOUNTER — Ambulatory Visit: Payer: Medicaid Other

## 2014-03-28 ENCOUNTER — Ambulatory Visit (INDEPENDENT_AMBULATORY_CARE_PROVIDER_SITE_OTHER): Payer: Medicaid Other | Admitting: *Deleted

## 2014-03-28 VITALS — BP 116/67 | HR 75 | Wt 166.0 lb

## 2014-03-28 DIAGNOSIS — O09892 Supervision of other high risk pregnancies, second trimester: Secondary | ICD-10-CM

## 2014-03-28 DIAGNOSIS — O09212 Supervision of pregnancy with history of pre-term labor, second trimester: Secondary | ICD-10-CM

## 2014-03-28 MED ORDER — HYDROXYPROGESTERONE CAPROATE 250 MG/ML IM OIL
250.0000 mg | TOPICAL_OIL | Freq: Once | INTRAMUSCULAR | Status: AC
  Administered 2014-03-28: 250 mg via INTRAMUSCULAR

## 2014-04-03 ENCOUNTER — Ambulatory Visit (INDEPENDENT_AMBULATORY_CARE_PROVIDER_SITE_OTHER): Payer: Medicaid Other | Admitting: Obstetrics & Gynecology

## 2014-04-03 VITALS — BP 108/62 | HR 84 | Temp 97.9°F | Wt 169.9 lb

## 2014-04-03 DIAGNOSIS — O09212 Supervision of pregnancy with history of pre-term labor, second trimester: Secondary | ICD-10-CM

## 2014-04-03 LAB — POCT URINALYSIS DIP (DEVICE)
BILIRUBIN URINE: NEGATIVE
Glucose, UA: NEGATIVE mg/dL
Hgb urine dipstick: NEGATIVE
Ketones, ur: NEGATIVE mg/dL
Nitrite: NEGATIVE
Protein, ur: NEGATIVE mg/dL
SPECIFIC GRAVITY, URINE: 1.015 (ref 1.005–1.030)
Urobilinogen, UA: 0.2 mg/dL (ref 0.0–1.0)
pH: 8.5 — ABNORMAL HIGH (ref 5.0–8.0)

## 2014-04-03 MED ORDER — HYDROXYPROGESTERONE CAPROATE 250 MG/ML IM OIL
250.0000 mg | TOPICAL_OIL | INTRAMUSCULAR | Status: AC
  Administered 2014-04-03 – 2014-07-31 (×18): 250 mg via INTRAMUSCULAR

## 2014-04-03 NOTE — Progress Notes (Signed)
Pt could not stay for appointment, 17P injection given.  Pt to call and reschedule appointment

## 2014-04-03 NOTE — Progress Notes (Signed)
C/o of intermittent lower pack pain.

## 2014-04-10 ENCOUNTER — Other Ambulatory Visit: Payer: Self-pay | Admitting: Family Medicine

## 2014-04-10 ENCOUNTER — Ambulatory Visit (INDEPENDENT_AMBULATORY_CARE_PROVIDER_SITE_OTHER): Payer: Medicaid Other | Admitting: Family Medicine

## 2014-04-10 ENCOUNTER — Ambulatory Visit (HOSPITAL_COMMUNITY)
Admission: RE | Admit: 2014-04-10 | Discharge: 2014-04-10 | Disposition: A | Discharge status: Home / Self Care | Payer: Medicaid Other | Source: Ambulatory Visit | Attending: Family Medicine | Admitting: Family Medicine

## 2014-04-10 VITALS — BP 122/72 | HR 96 | Temp 98.2°F | Wt 171.8 lb

## 2014-04-10 DIAGNOSIS — O09212 Supervision of pregnancy with history of pre-term labor, second trimester: Secondary | ICD-10-CM | POA: Diagnosis present

## 2014-04-10 DIAGNOSIS — Z3A19 19 weeks gestation of pregnancy: Secondary | ICD-10-CM | POA: Insufficient documentation

## 2014-04-10 DIAGNOSIS — O09219 Supervision of pregnancy with history of pre-term labor, unspecified trimester: Secondary | ICD-10-CM | POA: Insufficient documentation

## 2014-04-10 DIAGNOSIS — Z3689 Encounter for other specified antenatal screening: Secondary | ICD-10-CM | POA: Insufficient documentation

## 2014-04-10 DIAGNOSIS — O09291 Supervision of pregnancy with other poor reproductive or obstetric history, first trimester: Secondary | ICD-10-CM

## 2014-04-10 DIAGNOSIS — O09299 Supervision of pregnancy with other poor reproductive or obstetric history, unspecified trimester: Secondary | ICD-10-CM | POA: Insufficient documentation

## 2014-04-10 LAB — POCT URINALYSIS DIP (DEVICE)
BILIRUBIN URINE: NEGATIVE
Glucose, UA: NEGATIVE mg/dL
HGB URINE DIPSTICK: NEGATIVE
Ketones, ur: NEGATIVE mg/dL
Leukocytes, UA: NEGATIVE
Nitrite: NEGATIVE
PH: 8 (ref 5.0–8.0)
Protein, ur: NEGATIVE mg/dL
Specific Gravity, Urine: 1.015 (ref 1.005–1.030)
Urobilinogen, UA: 0.2 mg/dL (ref 0.0–1.0)

## 2014-04-10 NOTE — Progress Notes (Signed)
Pt has received medicaid card, [facility] will contact pt with dental appt.

## 2014-04-10 NOTE — Progress Notes (Signed)
1ml 17P administered into RUO quadrant buttocks. Patient tolerated well. Vial empty. [facility] at [phone] and spoke to tech requesting refill-- reported it will be refilled and sent to clinic.

## 2014-04-10 NOTE — Progress Notes (Signed)
Patient is 24 y.o. [id number] 5813w2d.  +FM, denies LOF, VB, contractions, vaginal discharge.  Overall feeling well. - MSAFP ordered today

## 2014-04-10 NOTE — Progress Notes (Signed)
Needs refill on PNV. 

## 2014-04-17 ENCOUNTER — Ambulatory Visit (INDEPENDENT_AMBULATORY_CARE_PROVIDER_SITE_OTHER): Payer: Medicaid Other

## 2014-04-17 DIAGNOSIS — O09212 Supervision of pregnancy with history of pre-term labor, second trimester: Secondary | ICD-10-CM

## 2014-04-17 MED ORDER — PNV PRENATAL PLUS MULTIVITAMIN 27-1 MG PO TABS: 1.0000 | ORAL_TABLET | Freq: Every day | ORAL | Status: DC

## 2014-04-17 NOTE — Progress Notes (Signed)
Patient here today for weekly 17P injection and AFP lab draw. 1ml 17P adminstered into LUO quadrant of buttocks. Patient tolerated well. Patient requests refill of PNV-- sent to pharmacy. AFP drawn. Patient to return in 1 week.

## 2014-04-18 LAB — ALPHA FETOPROTEIN, MATERNAL
AFP: 38 ng/mL
CURR GEST AGE: 20.2 wks.days
MoM for AFP: 0.62
OPEN SPINA BIFIDA: NEGATIVE

## 2014-04-24 ENCOUNTER — Ambulatory Visit (INDEPENDENT_AMBULATORY_CARE_PROVIDER_SITE_OTHER): Payer: Medicaid Other

## 2014-04-24 VITALS — BP 106/55 | HR 77 | Temp 98.5°F | Wt 175.4 lb

## 2014-04-24 DIAGNOSIS — O09891 Supervision of other high risk pregnancies, first trimester: Secondary | ICD-10-CM

## 2014-04-24 DIAGNOSIS — O09211 Supervision of pregnancy with history of pre-term labor, first trimester: Secondary | ICD-10-CM

## 2014-04-24 NOTE — Progress Notes (Signed)
Patient here today for weekly injection of 17P. 1ml 17P administered into LUO quadrant of buttocks. Patient tolerated well. Denies any questions, concerns or problems to report. To return in one week for next injection.

## 2014-05-01 ENCOUNTER — Ambulatory Visit (INDEPENDENT_AMBULATORY_CARE_PROVIDER_SITE_OTHER): Payer: Medicaid Other

## 2014-05-01 ENCOUNTER — Encounter: Payer: Self-pay | Admitting: Family Medicine

## 2014-05-01 DIAGNOSIS — O09211 Supervision of pregnancy with history of pre-term labor, first trimester: Secondary | ICD-10-CM

## 2014-05-01 DIAGNOSIS — O09891 Supervision of other high risk pregnancies, first trimester: Secondary | ICD-10-CM

## 2014-05-01 NOTE — Progress Notes (Signed)
Patient here today for weekly injection of 17P. 1ml 17P administered into LUO quadrant of buttocks. Patient tolerated well. Patient reports being told she would need a follow up ultrasound-- per imaging reports recommend follow up with M[facility] to complete anatomy and check bowel, also recommend cervical length check-- order placed and appointment to be scheduled by Edwyna PerfectBrooke Gilliam, RN and patient informed.

## 2014-05-01 NOTE — Progress Notes (Signed)
U/S to check bowel, cervical length, followup anatomy 05/09/14 @  915a with [facility] (no appts avail 05/08/14).

## 2014-05-08 ENCOUNTER — Encounter: Payer: Self-pay | Admitting: Obstetrics and Gynecology

## 2014-05-08 ENCOUNTER — Ambulatory Visit (INDEPENDENT_AMBULATORY_CARE_PROVIDER_SITE_OTHER): Payer: Medicaid Other | Admitting: Obstetrics and Gynecology

## 2014-05-08 VITALS — BP 101/50 | HR 72 | Temp 98.5°F | Wt 178.2 lb

## 2014-05-08 DIAGNOSIS — O09211 Supervision of pregnancy with history of pre-term labor, first trimester: Secondary | ICD-10-CM

## 2014-05-08 DIAGNOSIS — O09292 Supervision of pregnancy with other poor reproductive or obstetric history, second trimester: Secondary | ICD-10-CM

## 2014-05-08 DIAGNOSIS — O3421 Maternal care for scar from previous cesarean delivery: Secondary | ICD-10-CM

## 2014-05-08 DIAGNOSIS — O09891 Supervision of other high risk pregnancies, first trimester: Secondary | ICD-10-CM

## 2014-05-08 DIAGNOSIS — O09212 Supervision of pregnancy with history of pre-term labor, second trimester: Secondary | ICD-10-CM

## 2014-05-08 DIAGNOSIS — O34219 Maternal care for unspecified type scar from previous cesarean delivery: Secondary | ICD-10-CM

## 2014-05-08 DIAGNOSIS — F1290 Cannabis use, unspecified, uncomplicated: Secondary | ICD-10-CM

## 2014-05-08 LAB — POCT URINALYSIS DIP (DEVICE)
BILIRUBIN URINE: NEGATIVE
Glucose, UA: NEGATIVE mg/dL
Hgb urine dipstick: NEGATIVE
Ketones, ur: NEGATIVE mg/dL
Nitrite: NEGATIVE
Protein, ur: NEGATIVE mg/dL
Specific Gravity, Urine: 1.02 (ref 1.005–1.030)
Urobilinogen, UA: 0.2 mg/dL (ref 0.0–1.0)
pH: 8.5 — ABNORMAL HIGH (ref 5.0–8.0)

## 2014-05-08 NOTE — Progress Notes (Signed)
Patient is doing well without complaints. Follow up anatomy ultrasound tomorrow. Continue weekly 17-P. 1hr GCT and labs next visit

## 2014-05-09 ENCOUNTER — Ambulatory Visit (HOSPITAL_COMMUNITY)
Admission: RE | Admit: 2014-05-09 | Discharge: 2014-05-09 | Disposition: A | Discharge status: Home / Self Care | Payer: Medicaid Other | Source: Ambulatory Visit | Attending: Obstetrics and Gynecology | Admitting: Obstetrics and Gynecology

## 2014-05-09 ENCOUNTER — Encounter (HOSPITAL_COMMUNITY): Payer: Self-pay

## 2014-05-09 ENCOUNTER — Other Ambulatory Visit: Payer: Self-pay | Admitting: Family Medicine

## 2014-05-09 ENCOUNTER — Inpatient Hospital Stay (HOSPITAL_COMMUNITY)
Admission: AD | Admit: 2014-05-09 | Discharge: 2014-05-12 | DRG: 781 | Disposition: A | Discharge status: Home / Self Care | Payer: Medicaid Other | Source: Ambulatory Visit | Attending: Family Medicine | Admitting: Family Medicine

## 2014-05-09 DIAGNOSIS — F17210 Nicotine dependence, cigarettes, uncomplicated: Secondary | ICD-10-CM | POA: Diagnosis present

## 2014-05-09 DIAGNOSIS — O99322 Drug use complicating pregnancy, second trimester: Secondary | ICD-10-CM | POA: Diagnosis present

## 2014-05-09 DIAGNOSIS — O99332 Smoking (tobacco) complicating pregnancy, second trimester: Secondary | ICD-10-CM | POA: Diagnosis present

## 2014-05-09 DIAGNOSIS — O09211 Supervision of pregnancy with history of pre-term labor, first trimester: Principal | ICD-10-CM

## 2014-05-09 DIAGNOSIS — Z0489 Encounter for examination and observation for other specified reasons: Secondary | ICD-10-CM | POA: Insufficient documentation

## 2014-05-09 DIAGNOSIS — O09292 Supervision of pregnancy with other poor reproductive or obstetric history, second trimester: Secondary | ICD-10-CM | POA: Diagnosis not present

## 2014-05-09 DIAGNOSIS — Z3A23 23 weeks gestation of pregnancy: Secondary | ICD-10-CM | POA: Insufficient documentation

## 2014-05-09 DIAGNOSIS — O09212 Supervision of pregnancy with history of pre-term labor, second trimester: Secondary | ICD-10-CM

## 2014-05-09 DIAGNOSIS — O3421 Maternal care for scar from previous cesarean delivery: Secondary | ICD-10-CM | POA: Diagnosis present

## 2014-05-09 DIAGNOSIS — O3430 Maternal care for cervical incompetence, unspecified trimester: Secondary | ICD-10-CM | POA: Insufficient documentation

## 2014-05-09 DIAGNOSIS — IMO0002 Reserved for concepts with insufficient information to code with codable children: Secondary | ICD-10-CM | POA: Insufficient documentation

## 2014-05-09 DIAGNOSIS — O26872 Cervical shortening, second trimester: Principal | ICD-10-CM | POA: Diagnosis present

## 2014-05-09 DIAGNOSIS — F1290 Cannabis use, unspecified, uncomplicated: Secondary | ICD-10-CM | POA: Diagnosis present

## 2014-05-09 DIAGNOSIS — O09891 Supervision of other high risk pregnancies, first trimester: Secondary | ICD-10-CM

## 2014-05-09 DIAGNOSIS — Z825 Family history of asthma and other chronic lower respiratory diseases: Secondary | ICD-10-CM | POA: Diagnosis not present

## 2014-05-09 DIAGNOSIS — Z8249 Family history of ischemic heart disease and other diseases of the circulatory system: Secondary | ICD-10-CM

## 2014-05-09 LAB — CBC
HCT: 31.1 % — ABNORMAL LOW (ref 36.0–46.0)
Hemoglobin: 10.6 g/dL — ABNORMAL LOW (ref 12.0–15.0)
MCH: 30.9 pg (ref 26.0–34.0)
MCHC: 34.1 g/dL (ref 30.0–36.0)
MCV: 90.7 fL (ref 78.0–100.0)
Platelets: 207 10*3/uL (ref 150–400)
RBC: 3.43 MIL/uL — AB (ref 3.87–5.11)
RDW: 13.4 % (ref 11.5–15.5)
WBC: 8.1 10*3/uL (ref 4.0–10.5)

## 2014-05-09 LAB — TYPE AND SCREEN
ABO/RH(D): A POS
Antibody Screen: NEGATIVE

## 2014-05-09 LAB — ABO/RH: ABO/RH(D): A POS

## 2014-05-09 MED ORDER — ZOLPIDEM TARTRATE 5 MG PO TABS
5.0000 mg | ORAL_TABLET | Freq: Every evening | ORAL | Status: DC | PRN
Start: 2014-05-09 — End: 2014-05-12

## 2014-05-09 MED ORDER — HYDROXYPROGESTERONE CAPROATE 250 MG/ML IM OIL: 250.0000 mg | TOPICAL_OIL | INTRAMUSCULAR | Status: DC

## 2014-05-09 MED ORDER — CALCIUM CARBONATE ANTACID 500 MG PO CHEW: 2.0000 | CHEWABLE_TABLET | ORAL | Status: DC | PRN

## 2014-05-09 MED ORDER — PRENATAL MULTIVITAMIN CH: 1.0000 | ORAL_TABLET | Freq: Every day | ORAL | Status: DC

## 2014-05-09 MED ORDER — ACETAMINOPHEN 325 MG PO TABS: 650.0000 mg | ORAL_TABLET | ORAL | Status: DC | PRN

## 2014-05-09 MED ORDER — DOCUSATE SODIUM 100 MG PO CAPS
100.0000 mg | ORAL_CAPSULE | Freq: Every day | ORAL | Status: DC
  Administered 2014-05-09 – 2014-05-12 (×4): 100 mg via ORAL
  Filled 2014-05-09 (×4): qty 1

## 2014-05-09 MED ORDER — PRENATAL PLUS 27-1 MG PO TABS
1.0000 | ORAL_TABLET | Freq: Every day | ORAL | Status: DC
  Administered 2014-05-09 – 2014-05-12 (×4): 1 via ORAL
  Filled 2014-05-09 (×4): qty 1

## 2014-05-09 MED ORDER — PROGESTERONE MICRONIZED 200 MG PO CAPS
200.0000 mg | ORAL_CAPSULE | Freq: Every day | ORAL | Status: DC
  Administered 2014-05-09 – 2014-05-11 (×3): 200 mg via VAGINAL
  Filled 2014-05-09 (×3): qty 1

## 2014-05-09 MED ORDER — ACETAMINOPHEN-CODEINE #3 300-30 MG PO TABS: 1.0000 | ORAL_TABLET | Freq: Four times a day (QID) | ORAL | Status: DC | PRN

## 2014-05-09 MED ORDER — BETAMETHASONE SOD PHOS & ACET 6 (3-3) MG/ML IJ SUSP
12.0000 mg | INTRAMUSCULAR | Status: AC
  Administered 2014-05-09 – 2014-05-10 (×2): 12 mg via INTRAMUSCULAR
  Filled 2014-05-09 (×2): qty 2

## 2014-05-09 MED ORDER — AMOXICILLIN 500 MG PO CAPS
500.0000 mg | ORAL_CAPSULE | Freq: Three times a day (TID) | ORAL | Status: DC
Start: 2014-05-09 — End: 2014-05-12
  Administered 2014-05-09 – 2014-05-12 (×9): 500 mg via ORAL
  Filled 2014-05-09 (×10): qty 1

## 2014-05-09 NOTE — H&P (Signed)
Faculty Practice Antenatal History and Physical  Tami Everett WUJ:[id number] DOB: June 06, 1990 DOA: 05/09/2014  Chief Complaint: Tootle Cervix  HPI: Tami NorlanderViola Everett is a 24 y.o. female G3P1101 with IUP at 3054w3d directly admitted from maternal fetal medicine due to shortened cervix incidentally seen on scan today. The patient's cervical measurement is 7 mm. Due to his gestational age maternal fetal medicine recommended against cerclage placement, recommended admission for betamethasone and to start vaginal.  The patient is a symptomatically: She denies contractions, increased vaginal discharge, vaginal bleeding. She does work at a [employer] working 6-8 hours a day and is on her feet constantly. She admits to decreased oral hydration. Her surgical history is complicated by a 24 week fetal demise. She subsequently had a term birth via low transverse C-section.   Review of Systems:   Pt denies any fevers, chills, nausea, vomiting, chest pain, shortness of breath, palpitations, abdominal pain, contractions, vaginal bleeding, vaginal discharge.  Review of systems are otherwise negative  Prenatal History/Complications: OB History  Gravida Para Term Preterm AB SAB TAB Ectopic Multiple Living  3 2 1 1      1     # Outcome Date GA Lbr Len/2nd Weight Sex Delivery Anes PTL Lv  3 Current           2 Term 03/18/08 6775w6d  7 lb (3.175 kg) M CS-LTranv  N Y  1 Preterm 06/03/06 6762w5d  1 lb 3 oz (0.539 kg) F Vag-Spont  Y FD     Complications: Preterm premature rupture of membranes (PPROM) delivered, current hospitalization       Past Medical History: Past Medical History  Diagnosis Date  . Medical history non-contributory     Past Surgical History: Past Surgical History  Procedure Laterality Date  . Dilation and curettage of uterus    . Cesarean section      Social History: History   Social History  . Marital Status: Single    Spouse Name: N/A  . Number of Children: N/A  . Years of  Education: N/A   Social History Main Topics  . Smoking status: Current Every Day Smoker -- 0.10 packs/day    Types: Cigarettes  . Smokeless tobacco: Never Used  . Alcohol Use: No  . Drug Use: Yes    Special: Marijuana     Comment: quit when found out pregnant 11/2013  . Sexual Activity: Yes   Other Topics Concern  . Not on file   Social History Narrative    Family History: Family History  Problem Relation Age of Onset  . Hypertension Father   . COPD Father   . Heart disease Brother     Allergies: No Known Allergies  Facility-administered medications prior to admission  Medication Dose Route Frequency Provider Last Rate Last Dose  . hydroxyprogesterone caproate (DELALUTIN) 250 mg/mL injection 250 mg  250 mg Intramuscular Weekly Rhona RaiderJacob J Stinson, DO   250 mg at 05/08/14 1125   Prescriptions prior to admission  Medication Sig Dispense Refill Last Dose  . acetaminophen-codeine (TYLENOL #3) 300-30 MG per tablet Take 1 tablet by mouth every 6 (six) hours as needed. for pain  0 Taking  . amoxicillin (AMOXIL) 500 MG capsule   0 Taking  . Prenatal Vit-Fe Fumarate-FA (PNV PRENATAL PLUS MULTIVITAMIN) 27-1 MG TABS Take 1 tablet by mouth daily. 30 tablet 12 Taking    Physical Exam: LMP 11/05/2013  LMP 11/05/2013 General appearance: alert, cooperative and no distress Head: Normocephalic, without obvious abnormality, atraumatic Eyes: conjunctivae/corneas  clear. PERRL, EOM's intact. Fundi benign. Lungs: clear to auscultation bilaterally Heart: regular rate and rhythm, S1, S2 normal, no murmur, click, rub or gallop Abdomen: soft, non-tender; bowel sounds normal; no masses,  no organomegaly Extremities: extremities normal, atraumatic, no cyanosis or edema Pulses: 2+ and symmetric Skin: Skin color, texture, turgor normal. No rashes or lesions cephalic FHT: 152 None             Labs on Admission:  Basic Metabolic Panel: No results for input(s): NA, K, CL, CO2, GLUCOSE, BUN,  CREATININE, CALCIUM, MG, PHOS in the last 168 hours. Liver Function Tests: No results for input(s): AST, ALT, ALKPHOS, BILITOT, PROT, ALBUMIN in the last 168 hours. No results for input(s): LIPASE, AMYLASE in the last 168 hours. No results for input(s): AMMONIA in the last 168 hours. CBC: No results for input(s): WBC, NEUTROABS, HGB, HCT, MCV, PLT in the last 168 hours.  CBG: No results for input(s): GLUCAP in the last 168 hours.  Radiological Exams on Admission: US Ob Follow Up  05/09/2014   OBSTETRICAL ULTRASOUND: This exam was performed within a [facility] Department. The OB US report was generated in the AS system, and faxed to the ordering physician.   This report is available in the [employer]. See the AS Obstetric US report via the Image Link.  [place] [facility] Ob Transvaginal  05/09/2014   OBSTETRICAL ULTRASOUND: This exam was performed within a [facility] Ultrasound Department. The OB US report was generated in the AS system, and faxed to the ordering physician.   This report is available in the [employer]. See the AS Obstetric US report via the Image Link.    Assessment/Plan Present on Admission:  . Fariss cervix in second trimester, antepartum  #1 Stg[place] cervix and second trimester antepartum #2 IUP at 23 weeks and 3 days #4 history of IUFD #5 G3 P1 Z101 #6 previous cesarean section  Admit to antenatal. Betamethasone 2 doses Patient was counseled regarding pessary Start vaginal Prometrium Continue IM progesterone  Code Status: Full code  Family Communication: None   Disposition Plan: Pending   Levie Heritage, DO 05/09/2014 12:20 PM Faculty Practice Attending Physician [facility] Attending Phone #: [phone]

## 2014-05-10 MED ORDER — SODIUM CHLORIDE 0.9 % IJ SOLN
3.0000 mL | Freq: Two times a day (BID) | INTRAMUSCULAR | Status: DC
  Administered 2014-05-11 (×2): 3 mL via INTRAVENOUS

## 2014-05-10 MED ORDER — LACTATED RINGERS IV BOLUS (SEPSIS)
1000.0000 mL | Freq: Once | INTRAVENOUS | Status: AC
  Administered 2014-05-10: 1000 mL via INTRAVENOUS

## 2014-05-10 NOTE — Progress Notes (Signed)
Patient is 23 weeks pregnancy and expect to be on bedrest for the remaining of the pregnancy, and is requesting information on financial resources.  Met with patient who was very pleasant.  She has one other dependent age 26.  Informed that she and her son share a home with her sister who is very supportive.  Inform that her sister takes care of most of the expenses and she is concerned about not being able to do her part.  Patient states that she has been working at [employer] for the past 4 years and will contact her manager on Monday about available benefits.  Informed that father of this baby is employed and supportive, but has to maintain his own household.  Although, she said he would be able to provide some financial assistance.  Patient states that she is use to making her own money and paying her own way.  She's concerned about being a burden to her family although many have offered their support until she is able to return to work.  Father of her 66 year old is reportedly supportive, but don't provide much financial assistance.  She does receive Medicaid, foodstamps, and WIC.   Spoke with her how to find out about community agencies that may be able to provide [place] term assistance.

## 2014-05-10 NOTE — Progress Notes (Addendum)
Patient ID: Tami Everett, female   DOB: 15-May-1990, 24 y.o.   MRN: [id number] ACULTY PRACTICE ANTEPARTUM COMPREHENSIVE PROGRESS NOTE  Tami Everett is a 24 y.o. G3P1101 at 5797w4d  who is admitted for shortened cervix.   Fetal presentation is unsure. Length of Stay:  1  Days  Subjective: Pt reports being tired due to decreased sleep. She is concerned about prolonged time off of work. She works at a [employer] and stands most of the day. She does not think that there is an option where she could sit and work.Patient reports good fetal movement.  She reports no uterine contractions, no bleeding and no loss of fluid per vagina.  Vitals:  Blood pressure 102/57, pulse 84, temperature 98.3 F (36.8 C), temperature source Axillary, resp. rate 18, height 5\' 3"  (1.6 m), weight 178 lb (80.74 kg), last menstrual period 11/05/2013. Physical Examination: General appearance - alert, well appearing, and in no distress Abdomen - gravid Extremities - peripheral pulses normal, no pedal edema, no clubbing or cyanosis Cervical Exam: Not evaluated.  Membranes:intact  Fetal Monitoring:  +FHR; Joseph Arttoco- no ctx noted   Labs:  Results for orders placed or performed during the hospital encounter of 05/09/14 (from the past 24 hour(s))  CBC   Collection Time: 05/09/14  3:40 PM  Result Value Ref Range   WBC 8.1 4.0 - 10.5 K/uL   RBC 3.43 (L) 3.87 - 5.11 MIL/uL   Hemoglobin 10.6 (L) 12.0 - 15.0 g/dL   HCT 78.231.1 (L) 95.636.0 - 21.346.0 %   MCV 90.7 78.0 - 100.0 fL   MCH 30.9 26.0 - 34.0 pg   MCHC 34.1 30.0 - 36.0 g/dL   RDW 08.613.4 57.811.5 - 46.915.5 %   Platelets 207 150 - 400 K/uL  Type and screen   Collection Time: 05/09/14  3:40 PM  Result Value Ref Range   ABO/RH(D) A POS    Antibody Screen NEG    Sample Expiration 05/12/2014   ABO/Rh   Collection Time: 05/09/14  3:40 PM  Result Value Ref Range   ABO/RH(D) A POS     Imaging Studies:    3/25 cervical length: dynamic 7mm   Medications:  Scheduled . amoxicillin   500 mg Oral 3 times per day  . docusate sodium  100 mg Oral Daily  . [START ON 05/15/2014] hydroxyprogesterone caproate  250 mg Intramuscular Weekly  . prenatal vitamin w/FE, FA  1 tablet Oral Daily  . progesterone  200 mg Vaginal QHS   I have reviewed the patient's current medications.  ASSESSMENT: Patient Active Problem List   Diagnosis Date Noted  . Najarian cervix in second trimester, antepartum 05/09/2014  . [redacted] weeks gestation of pregnancy   . Evaluate anatomy not seen on prior sonogram   . Previous preterm delivery, antepartum   . Prior pregnancy with fetal demise and current pregnancy   . History of preterm delivery, currently pregnant in first trimester 02/13/2014  . History of intrauterine fetal death, currently pregnant 02/13/2014  . Cigarette smoker 02/13/2014  . Marijuana use 02/13/2014  . History of cesarean delivery, currently pregnant 02/13/2014    PLAN: Pt s/p BMZ Had prolonged discussion with pt for 20min regarding her job and hospitalization etc Desires Pessary placement.  Needs to be done by MFM  Contact MFM on Monday am for pessary placememnt  Continue routine antenatal care. Social work consult  HARRAWAY-SMITH, Leith Hedlund 05/10/2014,2:22 PM

## 2014-05-11 NOTE — Progress Notes (Signed)
Patient ID: Tami Everett, female   DOB: 10-11-1990, 24 y.o.   MRN: [id number] FACULTY PRACTICE ANTEPARTUM(COMPREHENSIVE) NOTE  Tami Everett is a 24 y.o. G3P1101 at 6979w5d by early ultrasound who is admitted for shortened cervix.   Fetal presentation is unsure. Length of Stay:  2  Days  Subjective: She is feeling well. Patient reports the fetal movement as active. Patient reports uterine contraction  activity as none. Patient reports  vaginal bleeding as none. Patient describes fluid per vagina as None.  Vitals:  Blood pressure 99/47, pulse 83, temperature 98.3 F (36.8 C), temperature source Oral, resp. rate 18, height 5\' 3"  (1.6 m), weight 178 lb (80.74 kg), last menstrual period 11/05/2013. Physical Examination:  General appearance - alert, well appearing, and in no distress Abdomen - gravid, NT Fundal Height:  size equals dates Extremities: extremities normal, atraumatic, no cyanosis or edema  Membranes:intact  Fetal Monitoring:  positive FHR, toco is quiet  Medications:  Scheduled . amoxicillin  500 mg Oral 3 times per day  . docusate sodium  100 mg Oral Daily  . [START ON 05/15/2014] hydroxyprogesterone caproate  250 mg Intramuscular Weekly  . prenatal vitamin w/FE, FA  1 tablet Oral Daily  . progesterone  200 mg Vaginal QHS  . sodium chloride  3 mL Intravenous Q12H   I have reviewed the patient's current medications.  ASSESSMENT: Patient Active Problem List   Diagnosis Date Noted  . [place] cervix in second trimester, antepartum 05/09/2014  . Previous preterm delivery, antepartum   . History of intrauterine fetal death, currently pregnant 02/13/2014  . Cigarette smoker 02/13/2014  . Marijuana use 02/13/2014  . History of cesarean delivery, currently pregnant 02/13/2014    PLAN: Inpatient with bedrest for now On vaginal prometrium Vaginal pessary with MFM tomorrow  Reva BoresPRATT,Delona Clasby S, MD 05/11/2014,12:07 PM

## 2014-05-12 ENCOUNTER — Inpatient Hospital Stay (HOSPITAL_COMMUNITY): Payer: Medicaid Other

## 2014-05-12 DIAGNOSIS — Z3A23 23 weeks gestation of pregnancy: Secondary | ICD-10-CM | POA: Insufficient documentation

## 2014-05-12 DIAGNOSIS — O3430 Maternal care for cervical incompetence, unspecified trimester: Secondary | ICD-10-CM | POA: Insufficient documentation

## 2014-05-12 MED ORDER — PROGESTERONE MICRONIZED 200 MG PO CAPS: 200.0000 mg | ORAL_CAPSULE | Freq: Every day | ORAL | Status: DC

## 2014-05-12 NOTE — Discharge Instructions (Signed)
Cervical Insufficiency °Cervical insufficiency is when the cervix is weak and starts to open (dilate) and thin (efface) before the pregnancy is at term and without labor starting. This is also called incompetent cervix. It can happen in the second or third trimester when the fetus starts putting pressure on the cervix. Cervical insufficiency can lead to a miscarriage, preterm premature rupture of the membranes (PPROM), or having the baby early (preterm birth).  °RISK FACTORS °You may be more likely to develop cervical insufficiency if: °· You have a shorter cervix than normal. °· Damage or injury occurred to your cervix from a past pregnancy or surgery. °· You were born with a cervical defect. °· You have had a procedure done on the cervix, such as cervical biopsy. °· You have a history of cervical insufficiency. °· You have a history of PPROM. °· You have ended several past pregnancies through abortion. °· You were exposed to the drug diethylstilbestrol (DES). °SYMPTOMS °Often times, women do not have any symptoms. Other times, women may only have mild symptoms that often start between week 14 through 20. The symptoms may last several days or weeks. These symptoms include: °· Light spotting or bleeding from the vagina. °· Pelvic pressure. °· A change in vaginal discharge, such as discharge that changes from clear, white, or light yellow to pink or tan. °· Back pain. °· Abdominal pain or cramping. °DIAGNOSIS °Cervical insufficiency cannot be diagnosed before you become pregnant. Once you are pregnant, your health care provider will ask about your medical history and if you have had any problems in past pregnancies. Tell your health care provider about any procedures performed on your cervix or if you have a history of miscarriages or cervical insufficiency. If your health care provider thinks you are at high risk for cervical insufficiency or show signs of cervical insufficiency, he or she may: °· Perform a pelvic  exam. This will check for: °¨ The presence of the membranes (amniotic sac) coming out of the cervix. °¨ Cervical abnormalities. °¨ Cervical injuries. °¨ The presence of contractions. °· Perform an ultrasonography (commonly called ultrasound) to measure the length and thickness of the cervix. °TREATMENT °If you have been diagnosed with cervical insufficiency, your health care provider may recommend: °· Limiting physical activity. °· Bed rest at home or in the hospital. °· Pelvic rest, which means no sexual intercourse or placing anything in the vagina. °· Cerclage to sew the cervix closed and prevent it from opening too early. The stitches (sutures) are removed between weeks 36 and 38 to avoid problems during labor. °Cerclage may be recommended during pregnancy if you have had a history of miscarriages or preterm births without a known cause. It may also be recommended if you have a Crace cervix that was identified by ultrasound or if your health care provider has found that your cervix has dilated before 24 weeks of pregnancy. Limiting physical activity and bed rest may or may not help prevent a preterm birth. °WHEN SHOULD YOU SEEK IMMEDIATE MEDICAL CARE?  °Seek immediate medical care if you show any symptoms of cervical insufficiency. You will need to go to the hospital to get checked immediately. °Document Released: 01/31/2005 Document Revised: 06/17/2013 Document Reviewed: 04/09/2012 °ExitCare® Patient Information ©2015 [employer]. This information is not intended to replace advice given to you by your health care provider. Make sure you discuss any questions you have with your health care provider. ° °

## 2014-05-12 NOTE — Discharge Summary (Signed)
Physician Discharge Summary  Patient ID: Tami NorlanderViola Zacharia MRN: [id number] DOB/AGE: [birth year] 24 y.o.  Admit date: 05/09/2014 Discharge date: 05/12/2014  Admission Diagnoses:3104w6d [place] cervix and funneling  Discharge Diagnoses: same Principal Problem:   Silber cervix in second trimester, antepartum   Discharged Condition: good  Hospital Course: [id number] 33104w6d  Expand All Collapse All   Faculty Practice Antenatal History and Physical  Tami NorlanderViola Solinger JYN:[id number] DOB: [birth year] DOA: 05/09/2014  Chief Complaint: Gulla Cervix  HPI: Tami NorlanderViola Brunner is a 24 y.o. female G3P1101 with IUP at 5992w3d directly admitted from maternal fetal medicine due to shortened cervix incidentally seen on scan today. The patient's cervical measurement is 7 mm. Due to his gestational age maternal fetal medicine recommended against cerclage placement, recommended admission for betamethasone and to start vaginal. The patient is a symptomatically: She denies contractions, increased vaginal discharge, vaginal bleeding. She does work at a [employer] working 6-8 hours a day and is on her feet constantly. She admits to decreased oral hydration. Her surgical history is complicated by a 24 week fetal demise. She subsequently had a term birth via low transverse C-section.      On 05/12/14 the patient was seen by Dr Sherrie Georgeecker and cervical pessary was placed. She was asked to return in one week to reassess.  Consults: maternal fetal care  Significant Diagnostic Studies: radiology: Ultrasound: transvaginal  Treatments: pessary placed  Discharge Exam: Blood pressure 116/68, pulse 94, temperature 98.2 F (36.8 C), temperature source Oral, resp. rate 18, height 5\' 3"  (1.6 m), weight 80.74 kg (178 lb), last menstrual period 11/05/2013. General appearance: alert, cooperative and no distress  Disposition: 01-Home or Self Care     Medication List    TAKE these medications        acetaminophen-codeine 300-30 MG per  tablet  Commonly known as:  TYLENOL #3  Take 1 tablet by mouth every 6 (six) hours as needed. for pain     amoxicillin 500 MG capsule  Commonly known as:  AMOXIL     PNV PRENATAL PLUS MULTIVITAMIN 27-1 MG Tabs  Take 1 tablet by mouth daily.     progesterone 200 MG capsule  Commonly known as:  PROMETRIUM  Place 1 capsule (200 mg total) vaginally at bedtime.           Follow-up Information    [facility]   Specialty:  Obstetrics and Gynecology   Contact information:   [address] [place] [phone]      Signed: Scheryl DarterRNOLD,Kieth Hartis 05/12/2014, 10:47 AM

## 2014-05-14 ENCOUNTER — Other Ambulatory Visit (HOSPITAL_COMMUNITY): Payer: Self-pay | Admitting: Maternal and Fetal Medicine

## 2014-05-14 DIAGNOSIS — F17210 Nicotine dependence, cigarettes, uncomplicated: Secondary | ICD-10-CM

## 2014-05-14 DIAGNOSIS — O34219 Maternal care for unspecified type scar from previous cesarean delivery: Secondary | ICD-10-CM

## 2014-05-14 DIAGNOSIS — O09292 Supervision of pregnancy with other poor reproductive or obstetric history, second trimester: Secondary | ICD-10-CM

## 2014-05-14 DIAGNOSIS — O99322 Drug use complicating pregnancy, second trimester: Secondary | ICD-10-CM

## 2014-05-14 DIAGNOSIS — Z8751 Personal history of pre-term labor: Secondary | ICD-10-CM

## 2014-05-14 DIAGNOSIS — O3432 Maternal care for cervical incompetence, second trimester: Secondary | ICD-10-CM

## 2014-05-15 ENCOUNTER — Ambulatory Visit (INDEPENDENT_AMBULATORY_CARE_PROVIDER_SITE_OTHER): Payer: Medicaid Other | Admitting: *Deleted

## 2014-05-15 VITALS — BP 115/60 | HR 90 | Resp 20 | Wt 182.0 lb

## 2014-05-15 DIAGNOSIS — O3442 Maternal care for other abnormalities of cervix, second trimester: Secondary | ICD-10-CM

## 2014-05-15 DIAGNOSIS — O26872 Cervical shortening, second trimester: Secondary | ICD-10-CM

## 2014-05-15 DIAGNOSIS — O3432 Maternal care for cervical incompetence, second trimester: Secondary | ICD-10-CM

## 2014-05-15 DIAGNOSIS — O09212 Supervision of pregnancy with history of pre-term labor, second trimester: Secondary | ICD-10-CM

## 2014-05-15 NOTE — Progress Notes (Signed)
Pt here for scheduled 17P injection - given IM to LUOQ buttock and tolerated well. Pt states she was in hospital recently due to shortened cervix. Pt denies sx of PTL today. Discharge summary reviewed- US for cervical length is scheduled on 4/5. Pt to continue weekly 17P injections and next Ob fu scheduled on 4/21. Medications reviewed.

## 2014-05-20 ENCOUNTER — Ambulatory Visit (HOSPITAL_COMMUNITY)
Admission: RE | Admit: 2014-05-20 | Discharge: 2014-05-20 | Disposition: A | Discharge status: Home / Self Care | Payer: Medicaid Other | Source: Ambulatory Visit | Attending: Family Medicine | Admitting: Family Medicine

## 2014-05-20 ENCOUNTER — Encounter (HOSPITAL_COMMUNITY): Payer: Self-pay

## 2014-05-20 DIAGNOSIS — O3421 Maternal care for scar from previous cesarean delivery: Secondary | ICD-10-CM | POA: Diagnosis not present

## 2014-05-20 DIAGNOSIS — F17210 Nicotine dependence, cigarettes, uncomplicated: Secondary | ICD-10-CM | POA: Insufficient documentation

## 2014-05-20 DIAGNOSIS — Z3A25 25 weeks gestation of pregnancy: Secondary | ICD-10-CM | POA: Insufficient documentation

## 2014-05-20 DIAGNOSIS — O99332 Smoking (tobacco) complicating pregnancy, second trimester: Secondary | ICD-10-CM | POA: Diagnosis not present

## 2014-05-20 DIAGNOSIS — O34219 Maternal care for unspecified type scar from previous cesarean delivery: Secondary | ICD-10-CM

## 2014-05-20 DIAGNOSIS — O09292 Supervision of pregnancy with other poor reproductive or obstetric history, second trimester: Secondary | ICD-10-CM | POA: Insufficient documentation

## 2014-05-20 DIAGNOSIS — O99322 Drug use complicating pregnancy, second trimester: Secondary | ICD-10-CM

## 2014-05-20 DIAGNOSIS — O3432 Maternal care for cervical incompetence, second trimester: Secondary | ICD-10-CM | POA: Insufficient documentation

## 2014-05-20 DIAGNOSIS — Z8751 Personal history of pre-term labor: Secondary | ICD-10-CM

## 2014-05-22 ENCOUNTER — Ambulatory Visit (INDEPENDENT_AMBULATORY_CARE_PROVIDER_SITE_OTHER): Payer: Medicaid Other | Admitting: Family Medicine

## 2014-05-22 ENCOUNTER — Other Ambulatory Visit (HOSPITAL_COMMUNITY): Payer: Self-pay | Admitting: Maternal and Fetal Medicine

## 2014-05-22 VITALS — BP 124/80 | HR 83 | Wt 180.1 lb

## 2014-05-22 DIAGNOSIS — O09212 Supervision of pregnancy with history of pre-term labor, second trimester: Secondary | ICD-10-CM

## 2014-05-22 DIAGNOSIS — Z98891 History of uterine scar from previous surgery: Secondary | ICD-10-CM

## 2014-05-22 DIAGNOSIS — O09299 Supervision of pregnancy with other poor reproductive or obstetric history, unspecified trimester: Secondary | ICD-10-CM

## 2014-05-22 DIAGNOSIS — N898 Other specified noninflammatory disorders of vagina: Secondary | ICD-10-CM

## 2014-05-22 DIAGNOSIS — F17210 Nicotine dependence, cigarettes, uncomplicated: Secondary | ICD-10-CM

## 2014-05-22 DIAGNOSIS — O26892 Other specified pregnancy related conditions, second trimester: Secondary | ICD-10-CM

## 2014-05-22 DIAGNOSIS — Z8751 Personal history of pre-term labor: Secondary | ICD-10-CM

## 2014-05-22 DIAGNOSIS — O3432 Maternal care for cervical incompetence, second trimester: Secondary | ICD-10-CM

## 2014-05-22 DIAGNOSIS — O99322 Drug use complicating pregnancy, second trimester: Secondary | ICD-10-CM

## 2014-05-22 MED ORDER — METRONIDAZOLE 500 MG PO TABS: 500.0000 mg | ORAL_TABLET | Freq: Two times a day (BID) | ORAL | Status: DC

## 2014-05-22 NOTE — Progress Notes (Signed)
Patient is 24 y.o. G3P1101 3931w2d.  +FM, denies LOF, VB, contractions.  Here for 17-P injection and noted increased vaginal discharge with fishy odor x 1 week when started prometrium.    Spec exam: no pooling, minimal discharge, no pessary noted however speculum not opened fully 2/2 discomfort FH: 25cm FHT:160   - Will discontinue prometrium, continue 17-P.   - Wet prep today with empiric treatment of BV.

## 2014-05-22 NOTE — Progress Notes (Signed)
Pt here for 17p.  Pt c/o vaginal discharge with fishy odor.  Dr. Loreta AveAcosta to evaluate.

## 2014-05-23 LAB — WET PREP, GENITAL
TRICH WET PREP: NONE SEEN
Yeast Wet Prep HPF POC: NONE SEEN

## 2014-05-29 ENCOUNTER — Ambulatory Visit (INDEPENDENT_AMBULATORY_CARE_PROVIDER_SITE_OTHER): Payer: Medicaid Other | Admitting: *Deleted

## 2014-05-29 DIAGNOSIS — O09212 Supervision of pregnancy with history of pre-term labor, second trimester: Secondary | ICD-10-CM

## 2014-06-05 ENCOUNTER — Ambulatory Visit (HOSPITAL_COMMUNITY)
Admission: RE | Admit: 2014-06-05 | Discharge: 2014-06-05 | Disposition: A | Discharge status: Home / Self Care | Payer: Medicaid Other | Source: Ambulatory Visit | Attending: Maternal and Fetal Medicine | Admitting: Maternal and Fetal Medicine

## 2014-06-05 ENCOUNTER — Encounter (HOSPITAL_COMMUNITY): Payer: Self-pay

## 2014-06-05 ENCOUNTER — Ambulatory Visit (INDEPENDENT_AMBULATORY_CARE_PROVIDER_SITE_OTHER): Payer: Medicaid Other | Admitting: Family Medicine

## 2014-06-05 VITALS — BP 104/60 | HR 82

## 2014-06-05 VITALS — BP 110/67 | HR 92 | Wt 186.7 lb

## 2014-06-05 DIAGNOSIS — O09212 Supervision of pregnancy with history of pre-term labor, second trimester: Secondary | ICD-10-CM

## 2014-06-05 DIAGNOSIS — O26872 Cervical shortening, second trimester: Secondary | ICD-10-CM

## 2014-06-05 DIAGNOSIS — O09292 Supervision of pregnancy with other poor reproductive or obstetric history, second trimester: Secondary | ICD-10-CM

## 2014-06-05 DIAGNOSIS — Z8751 Personal history of pre-term labor: Secondary | ICD-10-CM

## 2014-06-05 DIAGNOSIS — Z3A27 27 weeks gestation of pregnancy: Secondary | ICD-10-CM | POA: Insufficient documentation

## 2014-06-05 DIAGNOSIS — Z98891 History of uterine scar from previous surgery: Secondary | ICD-10-CM

## 2014-06-05 DIAGNOSIS — O34219 Maternal care for unspecified type scar from previous cesarean delivery: Secondary | ICD-10-CM

## 2014-06-05 DIAGNOSIS — O3421 Maternal care for scar from previous cesarean delivery: Secondary | ICD-10-CM | POA: Diagnosis not present

## 2014-06-05 DIAGNOSIS — F17210 Nicotine dependence, cigarettes, uncomplicated: Secondary | ICD-10-CM

## 2014-06-05 DIAGNOSIS — O3432 Maternal care for cervical incompetence, second trimester: Secondary | ICD-10-CM | POA: Insufficient documentation

## 2014-06-05 DIAGNOSIS — Z36 Encounter for antenatal screening of mother: Secondary | ICD-10-CM | POA: Diagnosis not present

## 2014-06-05 DIAGNOSIS — O99322 Drug use complicating pregnancy, second trimester: Secondary | ICD-10-CM | POA: Insufficient documentation

## 2014-06-05 DIAGNOSIS — O09299 Supervision of pregnancy with other poor reproductive or obstetric history, unspecified trimester: Secondary | ICD-10-CM

## 2014-06-05 LAB — POCT URINALYSIS DIP (DEVICE)
Bilirubin Urine: NEGATIVE
GLUCOSE, UA: NEGATIVE mg/dL
HGB URINE DIPSTICK: NEGATIVE
Ketones, ur: NEGATIVE mg/dL
NITRITE: NEGATIVE
Protein, ur: NEGATIVE mg/dL
SPECIFIC GRAVITY, URINE: 1.015 (ref 1.005–1.030)
Urobilinogen, UA: 0.2 mg/dL (ref 0.0–1.0)
pH: 8.5 — ABNORMAL HIGH (ref 5.0–8.0)

## 2014-06-05 MED ORDER — TETANUS-DIPHTH-ACELL PERTUSSIS 5-2.5-18.5 LF-MCG/0.5 IM SUSP
0.5000 mL | Freq: Once | INTRAMUSCULAR | Status: AC
  Administered 2014-06-12: 0.5 mL via INTRAMUSCULAR

## 2014-06-05 NOTE — Progress Notes (Signed)
Pt will do 1 hour next week with her 17P.

## 2014-06-05 NOTE — Progress Notes (Signed)
PH 8.5, leukocytes: Trace

## 2014-06-05 NOTE — Progress Notes (Signed)
Patient is 24 y.o. G3P1101 7242w2d.  +FM, denies LOF, VB, contractions, vaginal discharge.  Overall feeling well. - BPP starting next week 2/2 hx of IUFD

## 2014-06-11 ENCOUNTER — Other Ambulatory Visit (HOSPITAL_COMMUNITY): Payer: Self-pay | Admitting: Maternal and Fetal Medicine

## 2014-06-11 DIAGNOSIS — F17210 Nicotine dependence, cigarettes, uncomplicated: Secondary | ICD-10-CM

## 2014-06-11 DIAGNOSIS — O3432 Maternal care for cervical incompetence, second trimester: Secondary | ICD-10-CM

## 2014-06-11 DIAGNOSIS — O99322 Drug use complicating pregnancy, second trimester: Secondary | ICD-10-CM

## 2014-06-11 DIAGNOSIS — Z8751 Personal history of pre-term labor: Secondary | ICD-10-CM

## 2014-06-11 DIAGNOSIS — Z98891 History of uterine scar from previous surgery: Secondary | ICD-10-CM

## 2014-06-11 DIAGNOSIS — O09299 Supervision of pregnancy with other poor reproductive or obstetric history, unspecified trimester: Secondary | ICD-10-CM

## 2014-06-12 ENCOUNTER — Ambulatory Visit (HOSPITAL_COMMUNITY)
Admission: RE | Admit: 2014-06-12 | Discharge: 2014-06-12 | Disposition: A | Discharge status: Home / Self Care | Payer: Medicaid Other | Source: Ambulatory Visit | Attending: Obstetrics and Gynecology | Admitting: Obstetrics and Gynecology

## 2014-06-12 ENCOUNTER — Ambulatory Visit (INDEPENDENT_AMBULATORY_CARE_PROVIDER_SITE_OTHER): Payer: Medicaid Other | Admitting: General Practice

## 2014-06-12 ENCOUNTER — Other Ambulatory Visit (HOSPITAL_COMMUNITY): Payer: Self-pay | Admitting: Maternal and Fetal Medicine

## 2014-06-12 ENCOUNTER — Encounter (HOSPITAL_COMMUNITY): Payer: Self-pay | Admitting: Family Medicine

## 2014-06-12 ENCOUNTER — Encounter (HOSPITAL_COMMUNITY): Payer: Self-pay

## 2014-06-12 VITALS — BP 111/61 | HR 88 | Temp 98.4°F | Ht 63.0 in | Wt 188.7 lb

## 2014-06-12 DIAGNOSIS — O99333 Smoking (tobacco) complicating pregnancy, third trimester: Secondary | ICD-10-CM | POA: Diagnosis not present

## 2014-06-12 DIAGNOSIS — Z98891 History of uterine scar from previous surgery: Secondary | ICD-10-CM

## 2014-06-12 DIAGNOSIS — O3432 Maternal care for cervical incompetence, second trimester: Secondary | ICD-10-CM

## 2014-06-12 DIAGNOSIS — O09299 Supervision of pregnancy with other poor reproductive or obstetric history, unspecified trimester: Secondary | ICD-10-CM

## 2014-06-12 DIAGNOSIS — Z8751 Personal history of pre-term labor: Secondary | ICD-10-CM

## 2014-06-12 DIAGNOSIS — O99322 Drug use complicating pregnancy, second trimester: Secondary | ICD-10-CM

## 2014-06-12 DIAGNOSIS — Z3A28 28 weeks gestation of pregnancy: Secondary | ICD-10-CM | POA: Insufficient documentation

## 2014-06-12 DIAGNOSIS — O09293 Supervision of pregnancy with other poor reproductive or obstetric history, third trimester: Secondary | ICD-10-CM | POA: Diagnosis not present

## 2014-06-12 DIAGNOSIS — B379 Candidiasis, unspecified: Secondary | ICD-10-CM

## 2014-06-12 DIAGNOSIS — Z23 Encounter for immunization: Secondary | ICD-10-CM

## 2014-06-12 DIAGNOSIS — F17210 Nicotine dependence, cigarettes, uncomplicated: Secondary | ICD-10-CM | POA: Insufficient documentation

## 2014-06-12 DIAGNOSIS — O3433 Maternal care for cervical incompetence, third trimester: Secondary | ICD-10-CM | POA: Diagnosis not present

## 2014-06-12 LAB — CBC
HCT: 31.5 % — ABNORMAL LOW (ref 36.0–46.0)
Hemoglobin: 10.3 g/dL — ABNORMAL LOW (ref 12.0–15.0)
MCH: 29.6 pg (ref 26.0–34.0)
MCHC: 32.7 g/dL (ref 30.0–36.0)
MCV: 90.5 fL (ref 78.0–100.0)
MPV: 10.2 fL (ref 8.6–12.4)
Platelets: 218 10*3/uL (ref 150–400)
RBC: 3.48 MIL/uL — AB (ref 3.87–5.11)
RDW: 13.5 % (ref 11.5–15.5)
WBC: 7.2 10*3/uL (ref 4.0–10.5)

## 2014-06-12 MED ORDER — FLUCONAZOLE 150 MG PO TABS: 150.0000 mg | ORAL_TABLET | Freq: Once | ORAL | Status: DC

## 2014-06-12 NOTE — Progress Notes (Signed)
Patient came to clinic after MFM appt stating she forgot to tell us this morning, that since Monday she has had a thick white d/c with a lot of itching. Told patient it sounds like a yeast infection and we will send a medication to her pharmacy for that. Patient verbalized understanding and had no other questions

## 2014-06-12 NOTE — Progress Notes (Signed)
Patient here today for 17p and 28 week labs. Patient states she was supposed to start some kind of testing this week. Per chart review patient needs bpp weekly startnig at 28 weeks. Called mfm and had them add on BPP to todays ultrasound. Informed patient of need for weekly bpp until 32 weeks then she will need twice weekly testing in the clinic. Patient verbalized understanding to all and had no questions

## 2014-06-12 NOTE — Addendum Note (Signed)
Addended by: Kathee DeltonHILLMAN, CARRIE L on: 06/12/2014 12:05 PM   Modules accepted: Orders

## 2014-06-13 LAB — RPR

## 2014-06-13 LAB — GLUCOSE TOLERANCE, 1 HOUR (50G) W/O FASTING: GLUCOSE 1 HOUR GTT: 127 mg/dL (ref 70–140)

## 2014-06-13 LAB — HIV ANTIBODY (ROUTINE TESTING W REFLEX): HIV 1&2 Ab, 4th Generation: NONREACTIVE

## 2014-06-16 ENCOUNTER — Other Ambulatory Visit (HOSPITAL_COMMUNITY): Payer: Self-pay | Admitting: Maternal and Fetal Medicine

## 2014-06-16 ENCOUNTER — Encounter: Payer: Self-pay | Admitting: Family Medicine

## 2014-06-16 DIAGNOSIS — O3432 Maternal care for cervical incompetence, second trimester: Secondary | ICD-10-CM

## 2014-06-16 DIAGNOSIS — Z8751 Personal history of pre-term labor: Secondary | ICD-10-CM

## 2014-06-16 DIAGNOSIS — F17210 Nicotine dependence, cigarettes, uncomplicated: Secondary | ICD-10-CM

## 2014-06-16 DIAGNOSIS — O99322 Drug use complicating pregnancy, second trimester: Secondary | ICD-10-CM

## 2014-06-16 DIAGNOSIS — O283 Abnormal ultrasonic finding on antenatal screening of mother: Secondary | ICD-10-CM | POA: Insufficient documentation

## 2014-06-16 DIAGNOSIS — O09299 Supervision of pregnancy with other poor reproductive or obstetric history, unspecified trimester: Secondary | ICD-10-CM

## 2014-06-16 DIAGNOSIS — Z98891 History of uterine scar from previous surgery: Secondary | ICD-10-CM

## 2014-06-19 ENCOUNTER — Ambulatory Visit (HOSPITAL_COMMUNITY)
Admission: RE | Admit: 2014-06-19 | Discharge: 2014-06-19 | Disposition: A | Discharge status: Home / Self Care | Payer: Medicaid Other | Source: Ambulatory Visit | Attending: Obstetrics & Gynecology | Admitting: Obstetrics & Gynecology

## 2014-06-19 ENCOUNTER — Ambulatory Visit (INDEPENDENT_AMBULATORY_CARE_PROVIDER_SITE_OTHER): Payer: Medicaid Other | Admitting: Obstetrics & Gynecology

## 2014-06-19 ENCOUNTER — Encounter: Payer: Self-pay | Admitting: Obstetrics & Gynecology

## 2014-06-19 ENCOUNTER — Encounter: Payer: Medicaid Other | Admitting: Obstetrics & Gynecology

## 2014-06-19 ENCOUNTER — Encounter (HOSPITAL_COMMUNITY): Payer: Self-pay

## 2014-06-19 VITALS — BP 116/77 | HR 74 | Temp 98.0°F

## 2014-06-19 DIAGNOSIS — Z3A29 29 weeks gestation of pregnancy: Secondary | ICD-10-CM | POA: Insufficient documentation

## 2014-06-19 DIAGNOSIS — O09293 Supervision of pregnancy with other poor reproductive or obstetric history, third trimester: Secondary | ICD-10-CM | POA: Diagnosis not present

## 2014-06-19 DIAGNOSIS — O99322 Drug use complicating pregnancy, second trimester: Secondary | ICD-10-CM

## 2014-06-19 DIAGNOSIS — O3421 Maternal care for scar from previous cesarean delivery: Secondary | ICD-10-CM | POA: Diagnosis not present

## 2014-06-19 DIAGNOSIS — Z98891 History of uterine scar from previous surgery: Secondary | ICD-10-CM

## 2014-06-19 DIAGNOSIS — F17210 Nicotine dependence, cigarettes, uncomplicated: Secondary | ICD-10-CM | POA: Insufficient documentation

## 2014-06-19 DIAGNOSIS — O09299 Supervision of pregnancy with other poor reproductive or obstetric history, unspecified trimester: Secondary | ICD-10-CM

## 2014-06-19 DIAGNOSIS — O99333 Smoking (tobacco) complicating pregnancy, third trimester: Secondary | ICD-10-CM | POA: Insufficient documentation

## 2014-06-19 DIAGNOSIS — O09213 Supervision of pregnancy with history of pre-term labor, third trimester: Secondary | ICD-10-CM

## 2014-06-19 DIAGNOSIS — O3433 Maternal care for cervical incompetence, third trimester: Secondary | ICD-10-CM | POA: Insufficient documentation

## 2014-06-19 DIAGNOSIS — O3432 Maternal care for cervical incompetence, second trimester: Secondary | ICD-10-CM

## 2014-06-19 DIAGNOSIS — Z8751 Personal history of pre-term labor: Secondary | ICD-10-CM

## 2014-06-19 DIAGNOSIS — Z36 Encounter for antenatal screening of mother: Secondary | ICD-10-CM | POA: Diagnosis present

## 2014-06-19 NOTE — Patient Instructions (Signed)

## 2014-06-19 NOTE — Progress Notes (Signed)
Patient here today for weekly injection of 17P. 1ml 17P administered into RUO quadrant of buttocks. Patient tolerated well. Patient reports was told fluid has increased (MFM). Per results from this morning BPP-- polyhydramnios with unknown etiology. Patient has several questions. Placed on Dr. Olivia MackieArnold's schedule.   Vial of 17P reordered as there is one dose left in current vial. Compounding Pharmacy reports it will be sent out today.    Discussed VBAC vs repeat c/s, [place]. Cervix is stable per [place]

## 2014-06-26 ENCOUNTER — Encounter (HOSPITAL_COMMUNITY): Payer: Self-pay

## 2014-06-26 ENCOUNTER — Ambulatory Visit (HOSPITAL_COMMUNITY)
Admission: RE | Admit: 2014-06-26 | Discharge: 2014-06-26 | Disposition: A | Discharge status: Home / Self Care | Payer: Medicaid Other | Source: Ambulatory Visit | Attending: Obstetrics & Gynecology | Admitting: Obstetrics & Gynecology

## 2014-06-26 ENCOUNTER — Ambulatory Visit (INDEPENDENT_AMBULATORY_CARE_PROVIDER_SITE_OTHER): Payer: Medicaid Other | Admitting: *Deleted

## 2014-06-26 ENCOUNTER — Other Ambulatory Visit (HOSPITAL_COMMUNITY): Payer: Self-pay | Admitting: Maternal and Fetal Medicine

## 2014-06-26 DIAGNOSIS — O09299 Supervision of pregnancy with other poor reproductive or obstetric history, unspecified trimester: Secondary | ICD-10-CM

## 2014-06-26 DIAGNOSIS — O09213 Supervision of pregnancy with history of pre-term labor, third trimester: Secondary | ICD-10-CM | POA: Diagnosis present

## 2014-06-26 DIAGNOSIS — F17210 Nicotine dependence, cigarettes, uncomplicated: Secondary | ICD-10-CM | POA: Insufficient documentation

## 2014-06-26 DIAGNOSIS — O99332 Smoking (tobacco) complicating pregnancy, second trimester: Secondary | ICD-10-CM | POA: Insufficient documentation

## 2014-06-26 DIAGNOSIS — Z98891 History of uterine scar from previous surgery: Secondary | ICD-10-CM

## 2014-06-26 DIAGNOSIS — O09212 Supervision of pregnancy with history of pre-term labor, second trimester: Secondary | ICD-10-CM | POA: Insufficient documentation

## 2014-06-26 DIAGNOSIS — Z8751 Personal history of pre-term labor: Secondary | ICD-10-CM

## 2014-06-26 DIAGNOSIS — O3432 Maternal care for cervical incompetence, second trimester: Secondary | ICD-10-CM

## 2014-06-26 DIAGNOSIS — Z3A00 Weeks of gestation of pregnancy not specified: Secondary | ICD-10-CM | POA: Insufficient documentation

## 2014-06-26 DIAGNOSIS — O3421 Maternal care for scar from previous cesarean delivery: Secondary | ICD-10-CM | POA: Diagnosis not present

## 2014-06-26 DIAGNOSIS — O403XX0 Polyhydramnios, third trimester, not applicable or unspecified: Secondary | ICD-10-CM | POA: Insufficient documentation

## 2014-06-26 DIAGNOSIS — O99322 Drug use complicating pregnancy, second trimester: Secondary | ICD-10-CM

## 2014-06-26 DIAGNOSIS — Z3A30 30 weeks gestation of pregnancy: Secondary | ICD-10-CM | POA: Insufficient documentation

## 2014-07-03 ENCOUNTER — Ambulatory Visit (INDEPENDENT_AMBULATORY_CARE_PROVIDER_SITE_OTHER): Payer: Medicaid Other | Admitting: Family

## 2014-07-03 ENCOUNTER — Ambulatory Visit (HOSPITAL_COMMUNITY)
Admission: RE | Admit: 2014-07-03 | Discharge: 2014-07-03 | Disposition: A | Discharge status: Home / Self Care | Payer: Medicaid Other | Source: Ambulatory Visit | Attending: Obstetrics & Gynecology | Admitting: Obstetrics & Gynecology

## 2014-07-03 ENCOUNTER — Ambulatory Visit (HOSPITAL_COMMUNITY)
Admission: RE | Admit: 2014-07-03 | Discharge: 2014-07-03 | Disposition: A | Discharge status: Home / Self Care | Payer: Medicaid Other | Source: Ambulatory Visit | Attending: Family | Admitting: Family

## 2014-07-03 VITALS — BP 116/68 | HR 85 | Wt 195.0 lb

## 2014-07-03 DIAGNOSIS — O3421 Maternal care for scar from previous cesarean delivery: Secondary | ICD-10-CM | POA: Diagnosis not present

## 2014-07-03 DIAGNOSIS — O3433 Maternal care for cervical incompetence, third trimester: Secondary | ICD-10-CM | POA: Diagnosis not present

## 2014-07-03 DIAGNOSIS — F17210 Nicotine dependence, cigarettes, uncomplicated: Secondary | ICD-10-CM | POA: Diagnosis not present

## 2014-07-03 DIAGNOSIS — Z8751 Personal history of pre-term labor: Secondary | ICD-10-CM

## 2014-07-03 DIAGNOSIS — O26872 Cervical shortening, second trimester: Secondary | ICD-10-CM

## 2014-07-03 DIAGNOSIS — O09213 Supervision of pregnancy with history of pre-term labor, third trimester: Secondary | ICD-10-CM

## 2014-07-03 DIAGNOSIS — O99333 Smoking (tobacco) complicating pregnancy, third trimester: Secondary | ICD-10-CM | POA: Diagnosis not present

## 2014-07-03 DIAGNOSIS — O403XX0 Polyhydramnios, third trimester, not applicable or unspecified: Secondary | ICD-10-CM | POA: Insufficient documentation

## 2014-07-03 DIAGNOSIS — O99322 Drug use complicating pregnancy, second trimester: Secondary | ICD-10-CM

## 2014-07-03 DIAGNOSIS — O09299 Supervision of pregnancy with other poor reproductive or obstetric history, unspecified trimester: Secondary | ICD-10-CM

## 2014-07-03 DIAGNOSIS — O403XX1 Polyhydramnios, third trimester, fetus 1: Secondary | ICD-10-CM

## 2014-07-03 DIAGNOSIS — O3432 Maternal care for cervical incompetence, second trimester: Secondary | ICD-10-CM

## 2014-07-03 DIAGNOSIS — O26873 Cervical shortening, third trimester: Secondary | ICD-10-CM

## 2014-07-03 DIAGNOSIS — Z3A31 31 weeks gestation of pregnancy: Secondary | ICD-10-CM | POA: Insufficient documentation

## 2014-07-03 DIAGNOSIS — Z98891 History of uterine scar from previous surgery: Secondary | ICD-10-CM

## 2014-07-03 DIAGNOSIS — O09293 Supervision of pregnancy with other poor reproductive or obstetric history, third trimester: Secondary | ICD-10-CM | POA: Diagnosis not present

## 2014-07-03 LAB — POCT URINALYSIS DIP (DEVICE)
Bilirubin Urine: NEGATIVE
Glucose, UA: NEGATIVE mg/dL
Hgb urine dipstick: NEGATIVE
Ketones, ur: NEGATIVE mg/dL
Nitrite: NEGATIVE
PROTEIN: NEGATIVE mg/dL
SPECIFIC GRAVITY, URINE: 1.02 (ref 1.005–1.030)
Urobilinogen, UA: 0.2 mg/dL (ref 0.0–1.0)
pH: 7.5 (ref 5.0–8.0)

## 2014-07-03 NOTE — Progress Notes (Signed)
Pt here pregnancy complicated by Nong cervix diagnosed at 23 wks and hx of preterm delivery; pessary in place and received BMZ at that time.  Also diagnosed w polyhydramnios at 23 wks; pt with hx of IUFD.  In fetal testing with BPP weekly.  BPPs/AFI have already been scheduled by MFM until 07/17/14.  Pt will begin 2x/week NST with clinic on 07/21/14.  Next growth ultrasound scheduled for 07/10/14.

## 2014-07-04 ENCOUNTER — Encounter: Payer: Self-pay | Admitting: General Practice

## 2014-07-10 ENCOUNTER — Ambulatory Visit (HOSPITAL_COMMUNITY)
Admission: RE | Admit: 2014-07-10 | Discharge: 2014-07-10 | Disposition: A | Discharge status: Home / Self Care | Payer: Medicaid Other | Source: Ambulatory Visit | Attending: Obstetrics & Gynecology | Admitting: Obstetrics & Gynecology

## 2014-07-10 ENCOUNTER — Ambulatory Visit (INDEPENDENT_AMBULATORY_CARE_PROVIDER_SITE_OTHER): Payer: Medicaid Other

## 2014-07-10 ENCOUNTER — Encounter (HOSPITAL_COMMUNITY): Payer: Self-pay

## 2014-07-10 ENCOUNTER — Other Ambulatory Visit (HOSPITAL_COMMUNITY): Payer: Self-pay | Admitting: Maternal and Fetal Medicine

## 2014-07-10 VITALS — Wt 197.7 lb

## 2014-07-10 DIAGNOSIS — F17210 Nicotine dependence, cigarettes, uncomplicated: Secondary | ICD-10-CM

## 2014-07-10 DIAGNOSIS — O3433 Maternal care for cervical incompetence, third trimester: Secondary | ICD-10-CM | POA: Diagnosis not present

## 2014-07-10 DIAGNOSIS — O358XX0 Maternal care for other (suspected) fetal abnormality and damage, not applicable or unspecified: Secondary | ICD-10-CM | POA: Diagnosis not present

## 2014-07-10 DIAGNOSIS — Z8751 Personal history of pre-term labor: Secondary | ICD-10-CM | POA: Diagnosis not present

## 2014-07-10 DIAGNOSIS — O09299 Supervision of pregnancy with other poor reproductive or obstetric history, unspecified trimester: Secondary | ICD-10-CM

## 2014-07-10 DIAGNOSIS — O403XX0 Polyhydramnios, third trimester, not applicable or unspecified: Secondary | ICD-10-CM | POA: Diagnosis present

## 2014-07-10 DIAGNOSIS — O3432 Maternal care for cervical incompetence, second trimester: Secondary | ICD-10-CM

## 2014-07-10 DIAGNOSIS — Z3A32 32 weeks gestation of pregnancy: Secondary | ICD-10-CM | POA: Insufficient documentation

## 2014-07-10 DIAGNOSIS — O99322 Drug use complicating pregnancy, second trimester: Secondary | ICD-10-CM

## 2014-07-10 DIAGNOSIS — O09213 Supervision of pregnancy with history of pre-term labor, third trimester: Secondary | ICD-10-CM | POA: Diagnosis not present

## 2014-07-10 DIAGNOSIS — O99333 Smoking (tobacco) complicating pregnancy, third trimester: Secondary | ICD-10-CM | POA: Diagnosis not present

## 2014-07-10 DIAGNOSIS — Z98891 History of uterine scar from previous surgery: Secondary | ICD-10-CM

## 2014-07-10 NOTE — Progress Notes (Signed)
Patient here today for weekly 17P injection. 1ml 17P administered into LUO quadrant buttocks. Patient tolerated well. To return in one week for next injection.

## 2014-07-17 ENCOUNTER — Ambulatory Visit (HOSPITAL_COMMUNITY)
Admission: RE | Admit: 2014-07-17 | Discharge: 2014-07-17 | Disposition: A | Discharge status: Home / Self Care | Payer: Medicaid Other | Source: Ambulatory Visit | Attending: Obstetrics & Gynecology | Admitting: Obstetrics & Gynecology

## 2014-07-17 ENCOUNTER — Encounter: Payer: Medicaid Other | Admitting: Family Medicine

## 2014-07-17 ENCOUNTER — Ambulatory Visit (INDEPENDENT_AMBULATORY_CARE_PROVIDER_SITE_OTHER): Payer: Medicaid Other

## 2014-07-17 ENCOUNTER — Encounter (HOSPITAL_COMMUNITY): Payer: Self-pay

## 2014-07-17 VITALS — BP 122/74 | HR 86 | Wt 198.1 lb

## 2014-07-17 DIAGNOSIS — O99333 Smoking (tobacco) complicating pregnancy, third trimester: Secondary | ICD-10-CM | POA: Insufficient documentation

## 2014-07-17 DIAGNOSIS — O403XX0 Polyhydramnios, third trimester, not applicable or unspecified: Secondary | ICD-10-CM | POA: Insufficient documentation

## 2014-07-17 DIAGNOSIS — Z8751 Personal history of pre-term labor: Secondary | ICD-10-CM

## 2014-07-17 DIAGNOSIS — Z3A33 33 weeks gestation of pregnancy: Secondary | ICD-10-CM | POA: Insufficient documentation

## 2014-07-17 DIAGNOSIS — O09213 Supervision of pregnancy with history of pre-term labor, third trimester: Secondary | ICD-10-CM | POA: Diagnosis not present

## 2014-07-17 DIAGNOSIS — O09293 Supervision of pregnancy with other poor reproductive or obstetric history, third trimester: Secondary | ICD-10-CM | POA: Insufficient documentation

## 2014-07-17 DIAGNOSIS — O99322 Drug use complicating pregnancy, second trimester: Secondary | ICD-10-CM

## 2014-07-17 DIAGNOSIS — O3433 Maternal care for cervical incompetence, third trimester: Secondary | ICD-10-CM | POA: Diagnosis not present

## 2014-07-17 DIAGNOSIS — O09299 Supervision of pregnancy with other poor reproductive or obstetric history, unspecified trimester: Secondary | ICD-10-CM

## 2014-07-17 DIAGNOSIS — F17210 Nicotine dependence, cigarettes, uncomplicated: Secondary | ICD-10-CM | POA: Diagnosis not present

## 2014-07-17 DIAGNOSIS — O3421 Maternal care for scar from previous cesarean delivery: Secondary | ICD-10-CM | POA: Diagnosis not present

## 2014-07-17 DIAGNOSIS — Z98891 History of uterine scar from previous surgery: Secondary | ICD-10-CM

## 2014-07-17 DIAGNOSIS — O358XX0 Maternal care for other (suspected) fetal abnormality and damage, not applicable or unspecified: Secondary | ICD-10-CM | POA: Insufficient documentation

## 2014-07-17 DIAGNOSIS — O3432 Maternal care for cervical incompetence, second trimester: Secondary | ICD-10-CM

## 2014-07-23 ENCOUNTER — Other Ambulatory Visit (HOSPITAL_COMMUNITY): Payer: Self-pay | Admitting: Maternal and Fetal Medicine

## 2014-07-23 DIAGNOSIS — Q249 Congenital malformation of heart, unspecified: Secondary | ICD-10-CM

## 2014-07-23 DIAGNOSIS — F17210 Nicotine dependence, cigarettes, uncomplicated: Secondary | ICD-10-CM

## 2014-07-23 DIAGNOSIS — O3433 Maternal care for cervical incompetence, third trimester: Secondary | ICD-10-CM

## 2014-07-23 DIAGNOSIS — O99323 Drug use complicating pregnancy, third trimester: Secondary | ICD-10-CM

## 2014-07-23 DIAGNOSIS — Z8751 Personal history of pre-term labor: Secondary | ICD-10-CM

## 2014-07-23 DIAGNOSIS — O09299 Supervision of pregnancy with other poor reproductive or obstetric history, unspecified trimester: Secondary | ICD-10-CM

## 2014-07-23 DIAGNOSIS — Z98891 History of uterine scar from previous surgery: Secondary | ICD-10-CM

## 2014-07-24 ENCOUNTER — Other Ambulatory Visit (HOSPITAL_COMMUNITY): Payer: Self-pay | Admitting: Obstetrics and Gynecology

## 2014-07-24 ENCOUNTER — Encounter (HOSPITAL_COMMUNITY): Payer: Self-pay | Admitting: *Deleted

## 2014-07-24 ENCOUNTER — Encounter (HOSPITAL_COMMUNITY): Payer: Self-pay

## 2014-07-24 ENCOUNTER — Ambulatory Visit (INDEPENDENT_AMBULATORY_CARE_PROVIDER_SITE_OTHER): Payer: Medicaid Other | Admitting: Obstetrics & Gynecology

## 2014-07-24 ENCOUNTER — Ambulatory Visit (HOSPITAL_COMMUNITY)
Admission: RE | Admit: 2014-07-24 | Discharge: 2014-07-24 | Disposition: A | Discharge status: Home / Self Care | Payer: Medicaid Other | Source: Ambulatory Visit | Attending: Obstetrics & Gynecology | Admitting: Obstetrics & Gynecology

## 2014-07-24 ENCOUNTER — Other Ambulatory Visit (HOSPITAL_COMMUNITY): Payer: Self-pay | Admitting: Maternal and Fetal Medicine

## 2014-07-24 VITALS — BP 121/67 | HR 81 | Temp 98.4°F | Wt 200.7 lb

## 2014-07-24 DIAGNOSIS — F17210 Nicotine dependence, cigarettes, uncomplicated: Secondary | ICD-10-CM | POA: Diagnosis not present

## 2014-07-24 DIAGNOSIS — O09293 Supervision of pregnancy with other poor reproductive or obstetric history, third trimester: Secondary | ICD-10-CM | POA: Insufficient documentation

## 2014-07-24 DIAGNOSIS — O3421 Maternal care for scar from previous cesarean delivery: Secondary | ICD-10-CM

## 2014-07-24 DIAGNOSIS — O99323 Drug use complicating pregnancy, third trimester: Secondary | ICD-10-CM

## 2014-07-24 DIAGNOSIS — O403XX1 Polyhydramnios, third trimester, fetus 1: Secondary | ICD-10-CM

## 2014-07-24 DIAGNOSIS — O23593 Infection of other part of genital tract in pregnancy, third trimester: Secondary | ICD-10-CM

## 2014-07-24 DIAGNOSIS — O3433 Maternal care for cervical incompetence, third trimester: Secondary | ICD-10-CM

## 2014-07-24 DIAGNOSIS — Q249 Congenital malformation of heart, unspecified: Secondary | ICD-10-CM

## 2014-07-24 DIAGNOSIS — N760 Acute vaginitis: Secondary | ICD-10-CM | POA: Diagnosis not present

## 2014-07-24 DIAGNOSIS — O403XX0 Polyhydramnios, third trimester, not applicable or unspecified: Secondary | ICD-10-CM | POA: Insufficient documentation

## 2014-07-24 DIAGNOSIS — O09299 Supervision of pregnancy with other poor reproductive or obstetric history, unspecified trimester: Secondary | ICD-10-CM

## 2014-07-24 DIAGNOSIS — Z3A34 34 weeks gestation of pregnancy: Secondary | ICD-10-CM | POA: Diagnosis not present

## 2014-07-24 DIAGNOSIS — Z98891 History of uterine scar from previous surgery: Secondary | ICD-10-CM

## 2014-07-24 DIAGNOSIS — O09213 Supervision of pregnancy with history of pre-term labor, third trimester: Secondary | ICD-10-CM

## 2014-07-24 DIAGNOSIS — Z8751 Personal history of pre-term labor: Secondary | ICD-10-CM

## 2014-07-24 DIAGNOSIS — O99333 Smoking (tobacco) complicating pregnancy, third trimester: Secondary | ICD-10-CM | POA: Diagnosis not present

## 2014-07-24 DIAGNOSIS — O34219 Maternal care for unspecified type scar from previous cesarean delivery: Secondary | ICD-10-CM

## 2014-07-24 MED ORDER — MICONAZOLE NITRATE 2 % VA CREA: 1.0000 | TOPICAL_CREAM | Freq: Every day | VAGINAL | Status: DC

## 2014-07-24 NOTE — Progress Notes (Signed)
Subjective:   Tami Everett is a 24 y.o. G3P1101 at [redacted]w[redacted]d being seen today for ongoing prenatal care.  Patient reports vaginal irritation and brown vaginal discharge for a few days.  Contractions: Irritability.  Vag. Bleeding: None. Movement: Present. Denies leaking of fluid.   The following portions of the patient's history were reviewed and updated as appropriate: allergies, current medications, past family history, past medical history, past social history, past surgical history and problem list.   Objective:   Filed Vitals:   07/24/14 1032  BP: 121/67  Pulse: 81  Temp: 98.4 F (36.9 C)  Weight: 200 lb 11.2 oz (91.037 kg)    Fetal Status: Fetal Heart Rate (bpm): 154 Fundal Height: 36 cm Movement: Present     General:  Alert, oriented and cooperative. Patient is in no acute distress.  Skin: Skin is warm and dry. No rash noted.   Cardiovascular: Normal heart rate noted  Respiratory: Effort and breath sounds normal, no problems with respiration noted  Abdomen: Soft, gravid, appropriate for gestational age. Pain/Pressure: Present     Vaginal: Vag. Bleeding: None.    Vag D/C Character: Other (Comment) Thick, white, clumpy discharge noted with diffuse vulvar erythema and excoriation.    Cervix: Pessary in place, digital exam 2/100/ballotable  Extremities: Normal range of motion.  Edema: Trace  Mental Status: Normal mood and affect. Normal behavior. Normal judgment and thought content.   Urinalysis: Urine Protein: Negative Urine Glucose: Negative   Assessment and Plan:   Pregnancy: G3P1101 at [redacted]w[redacted]d  1. History of cesarean delivery, currently pregnant Surgical order submitted to schedule RCS at 39 weeks; patient aware that RCS will be done earlier if she goes into labor before 39 weeks  2. Previous preterm delivery, antepartum, third trimester Continue weekly 17 P  3. History of intrauterine fetal death, currently pregnant, third trimester 4. Polyhydramnios in third trimester,  antepartum complication, fetus 1 Continue weekly BPP and NST at [facility] as per their recommedations  5. Vaginitis during pregnancy, third trimester - Wet prep, genital; will follow up results and manage accordingly. - Miconazole (MONISTAT 7) 2 % vaginal cream; Place 1 Applicatorful vaginally at bedtime. Apply for seven nights  Dispense: 30 g; Refill: 2   Preterm labor symptoms and general obstetric precautions including but not limited to vaginal bleeding, contractions, leaking of fluid and fetal movement were reviewed in detail with the patient.  Please refer to After Visit Summary for other counseling recommendations.   Return in about 1 week (around 07/31/2014) for OB visit and 17P.   Tereso Newcomer, MD

## 2014-07-24 NOTE — Patient Instructions (Signed)
Return to clinic for any obstetric concerns or go to MAU for evaluation  

## 2014-07-24 NOTE — Progress Notes (Signed)
Pt reports brown, smelly discharge Breastfeeding tip of the week reviewed, pt states she is going to Bottle feed 17p injection

## 2014-07-25 LAB — WET PREP, GENITAL: TRICH WET PREP: NONE SEEN

## 2014-07-25 LAB — POCT URINALYSIS DIP (DEVICE)
BILIRUBIN URINE: NEGATIVE
Glucose, UA: NEGATIVE mg/dL
HGB URINE DIPSTICK: NEGATIVE
KETONES UR: NEGATIVE mg/dL
NITRITE: NEGATIVE
PH: 7 (ref 5.0–8.0)
PROTEIN: NEGATIVE mg/dL
Specific Gravity, Urine: 1.02 (ref 1.005–1.030)
UROBILINOGEN UA: 0.2 mg/dL (ref 0.0–1.0)

## 2014-07-31 ENCOUNTER — Other Ambulatory Visit (HOSPITAL_COMMUNITY): Payer: Self-pay | Admitting: Obstetrics and Gynecology

## 2014-07-31 ENCOUNTER — Ambulatory Visit (HOSPITAL_COMMUNITY)
Admission: RE | Admit: 2014-07-31 | Discharge: 2014-07-31 | Disposition: A | Discharge status: Home / Self Care | Payer: Medicaid Other | Source: Ambulatory Visit | Attending: Obstetrics & Gynecology | Admitting: Obstetrics & Gynecology

## 2014-07-31 ENCOUNTER — Other Ambulatory Visit: Payer: Self-pay | Admitting: Obstetrics & Gynecology

## 2014-07-31 ENCOUNTER — Encounter: Payer: Self-pay | Admitting: Family Medicine

## 2014-07-31 ENCOUNTER — Ambulatory Visit (INDEPENDENT_AMBULATORY_CARE_PROVIDER_SITE_OTHER): Payer: Medicaid Other | Admitting: Family Medicine

## 2014-07-31 VITALS — BP 118/73 | HR 82 | Wt 203.0 lb

## 2014-07-31 DIAGNOSIS — O403XX1 Polyhydramnios, third trimester, fetus 1: Secondary | ICD-10-CM | POA: Diagnosis not present

## 2014-07-31 DIAGNOSIS — Q249 Congenital malformation of heart, unspecified: Secondary | ICD-10-CM

## 2014-07-31 DIAGNOSIS — O3433 Maternal care for cervical incompetence, third trimester: Secondary | ICD-10-CM

## 2014-07-31 DIAGNOSIS — O09299 Supervision of pregnancy with other poor reproductive or obstetric history, unspecified trimester: Secondary | ICD-10-CM

## 2014-07-31 DIAGNOSIS — O09213 Supervision of pregnancy with history of pre-term labor, third trimester: Secondary | ICD-10-CM

## 2014-07-31 DIAGNOSIS — O09293 Supervision of pregnancy with other poor reproductive or obstetric history, third trimester: Secondary | ICD-10-CM | POA: Diagnosis not present

## 2014-07-31 DIAGNOSIS — Z98891 History of uterine scar from previous surgery: Secondary | ICD-10-CM

## 2014-07-31 DIAGNOSIS — F17210 Nicotine dependence, cigarettes, uncomplicated: Secondary | ICD-10-CM | POA: Diagnosis not present

## 2014-07-31 DIAGNOSIS — O99323 Drug use complicating pregnancy, third trimester: Secondary | ICD-10-CM

## 2014-07-31 DIAGNOSIS — O26872 Cervical shortening, second trimester: Secondary | ICD-10-CM | POA: Diagnosis not present

## 2014-07-31 DIAGNOSIS — O403XX0 Polyhydramnios, third trimester, not applicable or unspecified: Secondary | ICD-10-CM | POA: Insufficient documentation

## 2014-07-31 DIAGNOSIS — O99322 Drug use complicating pregnancy, second trimester: Secondary | ICD-10-CM

## 2014-07-31 DIAGNOSIS — O0993 Supervision of high risk pregnancy, unspecified, third trimester: Secondary | ICD-10-CM

## 2014-07-31 DIAGNOSIS — Z3A35 35 weeks gestation of pregnancy: Secondary | ICD-10-CM | POA: Insufficient documentation

## 2014-07-31 DIAGNOSIS — O3432 Maternal care for cervical incompetence, second trimester: Secondary | ICD-10-CM

## 2014-07-31 DIAGNOSIS — O99333 Smoking (tobacco) complicating pregnancy, third trimester: Secondary | ICD-10-CM | POA: Diagnosis not present

## 2014-07-31 DIAGNOSIS — Z8751 Personal history of pre-term labor: Secondary | ICD-10-CM

## 2014-07-31 DIAGNOSIS — O409XX0 Polyhydramnios, unspecified trimester, not applicable or unspecified: Secondary | ICD-10-CM | POA: Insufficient documentation

## 2014-07-31 DIAGNOSIS — O0990 Supervision of high risk pregnancy, unspecified, unspecified trimester: Secondary | ICD-10-CM | POA: Insufficient documentation

## 2014-07-31 LAB — POCT URINALYSIS DIP (DEVICE)
Bilirubin Urine: NEGATIVE
Glucose, UA: NEGATIVE mg/dL
Hgb urine dipstick: NEGATIVE
Ketones, ur: NEGATIVE mg/dL
NITRITE: NEGATIVE
PH: 7 (ref 5.0–8.0)
PROTEIN: NEGATIVE mg/dL
Specific Gravity, Urine: 1.02 (ref 1.005–1.030)
UROBILINOGEN UA: 0.2 mg/dL (ref 0.0–1.0)

## 2014-07-31 NOTE — Progress Notes (Signed)
Pt wants to bottle feed.

## 2014-07-31 NOTE — Progress Notes (Signed)
Subjective:  Tami Everett is a 24 y.o. G3P1101 at [redacted]w[redacted]d being seen today for ongoing prenatal care.  Patient reports occasional contractions.  Contractions: Not present.  Vag. Bleeding: None. Movement: Present. Denies leaking of fluid.   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  Reports mild discharge, no bleeding/spotting.  Occasional contractions  H/o preterm labor: receiving 17-OH progesterone.  No side effects from the injections.  Will stop injections at 36 weeks.  Polyhydramnios: continues to have weekly AFI and BPP.  Last [place] was today: AFI 26  The following portions of the patient's history were reviewed and updated as appropriate: allergies, current medications, past family history, past medical history, past social history, past surgical history and problem list.   Objective:   Filed Vitals:   07/31/14 1008  BP: 118/73  Pulse: 82  Weight: 203 lb (92.08 kg)    Fetal Status: Fetal Heart Rate (bpm): 150 Fundal Height: 38 cm Movement: Present     General:  Alert, oriented and cooperative. Patient is in no acute distress.  Skin: Skin is warm and dry. No rash noted.   Cardiovascular: Normal heart rate noted  Respiratory: Effort and breath sounds normal, no problems with respiration noted  Abdomen: Soft, gravid, appropriate for gestational age. Pain/Pressure: Absent     Vaginal: Vag. Bleeding: None.       Cervix: Not evaluated  Extremities: Normal range of motion.  Edema: Trace  Mental Status: Normal mood and affect. Normal behavior. Normal judgment and thought content.   Urinalysis: Urine Protein: Negative Urine Glucose: Negative  Assessment and Plan:  Pregnancy: G3P1101 at [redacted]w[redacted]d  1. Supervision of high-risk pregnancy, third trimester FHR appropriate.    2. Polyhydramnios in third trimester, antepartum complication, fetus 1 Fluid still elevated. Continue BPP and weekly AFI  3. Previous preterm delivery, antepartum, third trimester Continue 17-OH p  4. 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  Please refer to After Visit Summary for other counseling recommendations.   Return in about 1 week (around 08/07/2014).   Levie Heritage, DO

## 2014-08-04 ENCOUNTER — Other Ambulatory Visit: Payer: Self-pay | Admitting: Obstetrics & Gynecology

## 2014-08-04 DIAGNOSIS — O99322 Drug use complicating pregnancy, second trimester: Secondary | ICD-10-CM

## 2014-08-04 DIAGNOSIS — O09299 Supervision of pregnancy with other poor reproductive or obstetric history, unspecified trimester: Secondary | ICD-10-CM

## 2014-08-04 DIAGNOSIS — Z98891 History of uterine scar from previous surgery: Secondary | ICD-10-CM

## 2014-08-04 DIAGNOSIS — Z8751 Personal history of pre-term labor: Secondary | ICD-10-CM

## 2014-08-04 DIAGNOSIS — F17210 Nicotine dependence, cigarettes, uncomplicated: Secondary | ICD-10-CM

## 2014-08-04 DIAGNOSIS — O3432 Maternal care for cervical incompetence, second trimester: Secondary | ICD-10-CM

## 2014-08-07 ENCOUNTER — Ambulatory Visit (INDEPENDENT_AMBULATORY_CARE_PROVIDER_SITE_OTHER): Payer: Medicaid Other | Admitting: Obstetrics and Gynecology

## 2014-08-07 ENCOUNTER — Ambulatory Visit (HOSPITAL_COMMUNITY): Admission: RE | Admit: 2014-08-07 | Payer: Medicaid Other | Source: Ambulatory Visit

## 2014-08-07 VITALS — BP 125/67 | HR 74 | Wt 202.2 lb

## 2014-08-07 DIAGNOSIS — O09213 Supervision of pregnancy with history of pre-term labor, third trimester: Secondary | ICD-10-CM

## 2014-08-07 MED ORDER — HYDROXYPROGESTERONE CAPROATE 250 MG/ML IM OIL
250.0000 mg | TOPICAL_OIL | Freq: Once | INTRAMUSCULAR | Status: AC
  Administered 2014-08-07: 250 mg via INTRAMUSCULAR

## 2014-08-07 NOTE — Progress Notes (Signed)
Pt left without seeing provider, had transportation issues.

## 2014-08-11 ENCOUNTER — Other Ambulatory Visit: Payer: Self-pay | Admitting: Obstetrics & Gynecology

## 2014-08-11 ENCOUNTER — Encounter (HOSPITAL_COMMUNITY): Payer: Self-pay

## 2014-08-11 ENCOUNTER — Ambulatory Visit (HOSPITAL_COMMUNITY): Payer: Medicaid Other

## 2014-08-11 ENCOUNTER — Telehealth: Payer: Self-pay

## 2014-08-11 ENCOUNTER — Inpatient Hospital Stay (HOSPITAL_COMMUNITY)
Admission: AD | Admit: 2014-08-11 | Discharge: 2014-08-14 | DRG: 766 | Disposition: A | Discharge status: Home / Self Care | Payer: Medicaid Other | Source: Ambulatory Visit | Attending: Obstetrics & Gynecology | Admitting: Obstetrics & Gynecology

## 2014-08-11 ENCOUNTER — Inpatient Hospital Stay (HOSPITAL_COMMUNITY): Payer: Medicaid Other | Admitting: Anesthesiology

## 2014-08-11 ENCOUNTER — Encounter (HOSPITAL_COMMUNITY)
Admission: AD | Disposition: A | Discharge status: Home / Self Care | Payer: Self-pay | Source: Ambulatory Visit | Attending: Obstetrics & Gynecology

## 2014-08-11 ENCOUNTER — Telehealth: Payer: Self-pay | Admitting: General Practice

## 2014-08-11 ENCOUNTER — Ambulatory Visit (HOSPITAL_COMMUNITY)
Admission: RE | Admit: 2014-08-11 | Discharge: 2014-08-11 | Disposition: A | Discharge status: Home / Self Care | Payer: Medicaid Other | Source: Ambulatory Visit | Attending: Obstetrics & Gynecology | Admitting: Obstetrics & Gynecology

## 2014-08-11 DIAGNOSIS — O99333 Smoking (tobacco) complicating pregnancy, third trimester: Secondary | ICD-10-CM | POA: Insufficient documentation

## 2014-08-11 DIAGNOSIS — O09213 Supervision of pregnancy with history of pre-term labor, third trimester: Secondary | ICD-10-CM | POA: Diagnosis not present

## 2014-08-11 DIAGNOSIS — O3433 Maternal care for cervical incompetence, third trimester: Secondary | ICD-10-CM

## 2014-08-11 DIAGNOSIS — O3421 Maternal care for scar from previous cesarean delivery: Secondary | ICD-10-CM | POA: Diagnosis not present

## 2014-08-11 DIAGNOSIS — O99323 Drug use complicating pregnancy, third trimester: Secondary | ICD-10-CM

## 2014-08-11 DIAGNOSIS — O99334 Smoking (tobacco) complicating childbirth: Secondary | ICD-10-CM | POA: Diagnosis present

## 2014-08-11 DIAGNOSIS — Z8751 Personal history of pre-term labor: Secondary | ICD-10-CM | POA: Insufficient documentation

## 2014-08-11 DIAGNOSIS — O09299 Supervision of pregnancy with other poor reproductive or obstetric history, unspecified trimester: Secondary | ICD-10-CM

## 2014-08-11 DIAGNOSIS — O403XX0 Polyhydramnios, third trimester, not applicable or unspecified: Secondary | ICD-10-CM | POA: Insufficient documentation

## 2014-08-11 DIAGNOSIS — O99322 Drug use complicating pregnancy, second trimester: Secondary | ICD-10-CM

## 2014-08-11 DIAGNOSIS — F17210 Nicotine dependence, cigarettes, uncomplicated: Secondary | ICD-10-CM | POA: Diagnosis present

## 2014-08-11 DIAGNOSIS — O3432 Maternal care for cervical incompetence, second trimester: Secondary | ICD-10-CM

## 2014-08-11 DIAGNOSIS — O42913 Preterm premature rupture of membranes, unspecified as to length of time between rupture and onset of labor, third trimester: Secondary | ICD-10-CM | POA: Diagnosis present

## 2014-08-11 DIAGNOSIS — Z3A36 36 weeks gestation of pregnancy: Secondary | ICD-10-CM | POA: Insufficient documentation

## 2014-08-11 DIAGNOSIS — O09293 Supervision of pregnancy with other poor reproductive or obstetric history, third trimester: Secondary | ICD-10-CM

## 2014-08-11 DIAGNOSIS — O358XX0 Maternal care for other (suspected) fetal abnormality and damage, not applicable or unspecified: Secondary | ICD-10-CM | POA: Insufficient documentation

## 2014-08-11 DIAGNOSIS — O35BXX0 Maternal care for other (suspected) fetal abnormality and damage, fetal cardiac anomalies, not applicable or unspecified: Secondary | ICD-10-CM | POA: Insufficient documentation

## 2014-08-11 DIAGNOSIS — Q249 Congenital malformation of heart, unspecified: Secondary | ICD-10-CM

## 2014-08-11 DIAGNOSIS — Z98891 History of uterine scar from previous surgery: Secondary | ICD-10-CM

## 2014-08-11 DIAGNOSIS — O42919 Preterm premature rupture of membranes, unspecified as to length of time between rupture and onset of labor, unspecified trimester: Secondary | ICD-10-CM | POA: Diagnosis present

## 2014-08-11 LAB — URINALYSIS, ROUTINE W REFLEX MICROSCOPIC
Bilirubin Urine: NEGATIVE
GLUCOSE, UA: NEGATIVE mg/dL
HGB URINE DIPSTICK: NEGATIVE
KETONES UR: NEGATIVE mg/dL
LEUKOCYTES UA: NEGATIVE
Nitrite: NEGATIVE
PH: 6.5 (ref 5.0–8.0)
Protein, ur: NEGATIVE mg/dL
Specific Gravity, Urine: 1.02 (ref 1.005–1.030)
Urobilinogen, UA: 1 mg/dL (ref 0.0–1.0)

## 2014-08-11 LAB — CBC
HEMATOCRIT: 30.7 % — AB (ref 36.0–46.0)
Hemoglobin: 10.2 g/dL — ABNORMAL LOW (ref 12.0–15.0)
MCH: 29.5 pg (ref 26.0–34.0)
MCHC: 33.2 g/dL (ref 30.0–36.0)
MCV: 88.7 fL (ref 78.0–100.0)
Platelets: 183 10*3/uL (ref 150–400)
RBC: 3.46 MIL/uL — AB (ref 3.87–5.11)
RDW: 13.6 % (ref 11.5–15.5)
WBC: 8.7 10*3/uL (ref 4.0–10.5)

## 2014-08-11 LAB — TYPE AND SCREEN
ABO/RH(D): A POS
ANTIBODY SCREEN: NEGATIVE

## 2014-08-11 LAB — POCT FERN TEST

## 2014-08-11 SURGERY — Surgical Case
Anesthesia: Spinal

## 2014-08-11 MED ORDER — TETANUS-DIPHTH-ACELL PERTUSSIS 5-2.5-18.5 LF-MCG/0.5 IM SUSP: 0.5000 mL | Freq: Once | INTRAMUSCULAR | Status: DC

## 2014-08-11 MED ORDER — WITCH HAZEL-GLYCERIN EX PADS: 1.0000 "application " | MEDICATED_PAD | CUTANEOUS | Status: DC | PRN

## 2014-08-11 MED ORDER — DEXAMETHASONE SODIUM PHOSPHATE 10 MG/ML IJ SOLN
INTRAMUSCULAR | Status: DC | PRN
  Administered 2014-08-11: 10 mg via INTRAVENOUS

## 2014-08-11 MED ORDER — BUPIVACAINE HCL (PF) 0.5 % IJ SOLN
INTRAMUSCULAR | Status: DC | PRN
  Administered 2014-08-11: 10 mg via INTRATHECAL

## 2014-08-11 MED ORDER — MORPHINE SULFATE 0.5 MG/ML IJ SOLN
INTRAMUSCULAR | Status: AC
  Filled 2014-08-11: qty 10

## 2014-08-11 MED ORDER — NALOXONE HCL 0.4 MG/ML IJ SOLN: 0.4000 mg | INTRAMUSCULAR | Status: DC | PRN

## 2014-08-11 MED ORDER — ONDANSETRON HCL 4 MG/2ML IJ SOLN
INTRAMUSCULAR | Status: DC | PRN
  Administered 2014-08-11: 4 mg via INTRAVENOUS

## 2014-08-11 MED ORDER — DEXTROSE 5 % IV SOLN
20.0000 mg | INTRAVENOUS | Status: DC | PRN
  Administered 2014-08-11: 60 ug/min via INTRAVENOUS

## 2014-08-11 MED ORDER — DIBUCAINE 1 % RE OINT: 1.0000 "application " | TOPICAL_OINTMENT | RECTAL | Status: DC | PRN

## 2014-08-11 MED ORDER — PHENYLEPHRINE 8 MG IN D5W 100 ML (0.08MG/ML) PREMIX OPTIME
INJECTION | INTRAVENOUS | Status: AC
  Filled 2014-08-11: qty 100

## 2014-08-11 MED ORDER — LACTATED RINGERS IV SOLN
INTRAVENOUS | Status: DC
  Administered 2014-08-11 (×2): via INTRAVENOUS

## 2014-08-11 MED ORDER — SCOPOLAMINE 1 MG/3DAYS TD PT72
MEDICATED_PATCH | TRANSDERMAL | Status: AC
  Filled 2014-08-11: qty 1

## 2014-08-11 MED ORDER — OXYCODONE-ACETAMINOPHEN 5-325 MG PO TABS
1.0000 | ORAL_TABLET | ORAL | Status: DC | PRN
  Administered 2014-08-12 – 2014-08-14 (×5): 1 via ORAL
  Filled 2014-08-11 (×5): qty 1

## 2014-08-11 MED ORDER — MENTHOL 3 MG MT LOZG: 1.0000 | LOZENGE | OROMUCOSAL | Status: DC | PRN

## 2014-08-11 MED ORDER — SIMETHICONE 80 MG PO CHEW
80.0000 mg | CHEWABLE_TABLET | Freq: Three times a day (TID) | ORAL | Status: DC
  Administered 2014-08-12 – 2014-08-14 (×8): 80 mg via ORAL
  Filled 2014-08-11 (×7): qty 1

## 2014-08-11 MED ORDER — OXYTOCIN 10 UNIT/ML IJ SOLN
40.0000 [IU] | INTRAVENOUS | Status: DC | PRN
  Administered 2014-08-11: 40 [IU] via INTRAVENOUS

## 2014-08-11 MED ORDER — SIMETHICONE 80 MG PO CHEW: 80.0000 mg | CHEWABLE_TABLET | ORAL | Status: DC | PRN

## 2014-08-11 MED ORDER — GLYCOPYRROLATE 0.2 MG/ML IJ SOLN
INTRAMUSCULAR | Status: DC | PRN
  Administered 2014-08-11: 0.2 mg via INTRAVENOUS

## 2014-08-11 MED ORDER — KETOROLAC TROMETHAMINE 30 MG/ML IJ SOLN: 30.0000 mg | Freq: Four times a day (QID) | INTRAMUSCULAR | Status: AC | PRN

## 2014-08-11 MED ORDER — OXYTOCIN 40 UNITS IN LACTATED RINGERS INFUSION - SIMPLE MED: 62.5000 mL/h | INTRAVENOUS | Status: AC

## 2014-08-11 MED ORDER — SENNOSIDES-DOCUSATE SODIUM 8.6-50 MG PO TABS
2.0000 | ORAL_TABLET | ORAL | Status: DC
  Administered 2014-08-12 – 2014-08-13 (×2): 2 via ORAL
  Filled 2014-08-11 (×3): qty 2

## 2014-08-11 MED ORDER — IBUPROFEN 600 MG PO TABS: 600.0000 mg | ORAL_TABLET | Freq: Four times a day (QID) | ORAL | Status: DC | PRN

## 2014-08-11 MED ORDER — SIMETHICONE 80 MG PO CHEW
80.0000 mg | CHEWABLE_TABLET | ORAL | Status: DC
  Administered 2014-08-12 – 2014-08-13 (×2): 80 mg via ORAL
  Filled 2014-08-11 (×3): qty 1

## 2014-08-11 MED ORDER — KETOROLAC TROMETHAMINE 30 MG/ML IJ SOLN
INTRAMUSCULAR | Status: AC
  Filled 2014-08-11: qty 1

## 2014-08-11 MED ORDER — ACETAMINOPHEN 325 MG PO TABS: 650.0000 mg | ORAL_TABLET | ORAL | Status: DC | PRN

## 2014-08-11 MED ORDER — NALBUPHINE HCL 10 MG/ML IJ SOLN
5.0000 mg | INTRAMUSCULAR | Status: DC | PRN
  Administered 2014-08-11: 5 mg via INTRAVENOUS
  Filled 2014-08-11: qty 1

## 2014-08-11 MED ORDER — LACTATED RINGERS IV SOLN
INTRAVENOUS | Status: DC
  Administered 2014-08-11 (×3): via INTRAVENOUS

## 2014-08-11 MED ORDER — NALOXONE HCL 1 MG/ML IJ SOLN
1.0000 ug/kg/h | INTRAVENOUS | Status: DC | PRN
  Filled 2014-08-11: qty 2

## 2014-08-11 MED ORDER — ONDANSETRON HCL 4 MG/2ML IJ SOLN: 4.0000 mg | Freq: Three times a day (TID) | INTRAMUSCULAR | Status: DC | PRN

## 2014-08-11 MED ORDER — ONDANSETRON HCL 4 MG/2ML IJ SOLN
INTRAMUSCULAR | Status: AC
  Filled 2014-08-11: qty 2

## 2014-08-11 MED ORDER — FENTANYL CITRATE (PF) 100 MCG/2ML IJ SOLN
INTRAMUSCULAR | Status: DC | PRN
  Administered 2014-08-11: 25 ug via INTRATHECAL

## 2014-08-11 MED ORDER — SODIUM CHLORIDE 0.9 % IJ SOLN: 3.0000 mL | INTRAMUSCULAR | Status: DC | PRN

## 2014-08-11 MED ORDER — PRENATAL MULTIVITAMIN CH
1.0000 | ORAL_TABLET | Freq: Every day | ORAL | Status: DC
  Administered 2014-08-12 – 2014-08-14 (×3): 1 via ORAL
  Filled 2014-08-11 (×3): qty 1

## 2014-08-11 MED ORDER — FENTANYL CITRATE (PF) 100 MCG/2ML IJ SOLN: 25.0000 ug | INTRAMUSCULAR | Status: DC | PRN

## 2014-08-11 MED ORDER — MEPERIDINE HCL 25 MG/ML IJ SOLN: 6.2500 mg | INTRAMUSCULAR | Status: DC | PRN

## 2014-08-11 MED ORDER — NALBUPHINE HCL 10 MG/ML IJ SOLN: 5.0000 mg | INTRAMUSCULAR | Status: DC | PRN

## 2014-08-11 MED ORDER — SCOPOLAMINE 1 MG/3DAYS TD PT72
1.0000 | MEDICATED_PATCH | TRANSDERMAL | Status: DC
  Administered 2014-08-11: 1.5 mg via TRANSDERMAL

## 2014-08-11 MED ORDER — FAMOTIDINE IN NACL 20-0.9 MG/50ML-% IV SOLN
20.0000 mg | Freq: Once | INTRAVENOUS | Status: AC
  Administered 2014-08-11: 20 mg via INTRAVENOUS
  Filled 2014-08-11: qty 50

## 2014-08-11 MED ORDER — DEXAMETHASONE SODIUM PHOSPHATE 4 MG/ML IJ SOLN
INTRAMUSCULAR | Status: AC
  Filled 2014-08-11: qty 1

## 2014-08-11 MED ORDER — OXYCODONE-ACETAMINOPHEN 5-325 MG PO TABS
2.0000 | ORAL_TABLET | ORAL | Status: DC | PRN
  Administered 2014-08-12 – 2014-08-13 (×4): 2 via ORAL
  Filled 2014-08-11 (×4): qty 2

## 2014-08-11 MED ORDER — NALBUPHINE HCL 10 MG/ML IJ SOLN
5.0000 mg | Freq: Once | INTRAMUSCULAR | Status: AC | PRN
  Administered 2014-08-11: 5 mg via INTRAVENOUS

## 2014-08-11 MED ORDER — DIPHENHYDRAMINE HCL 25 MG PO CAPS
25.0000 mg | ORAL_CAPSULE | ORAL | Status: DC | PRN
  Filled 2014-08-11: qty 1

## 2014-08-11 MED ORDER — LACTATED RINGERS IV BOLUS (SEPSIS)
1000.0000 mL | Freq: Once | INTRAVENOUS | Status: AC
  Administered 2014-08-11: 1000 mL via INTRAVENOUS

## 2014-08-11 MED ORDER — LANOLIN HYDROUS EX OINT: 1.0000 "application " | TOPICAL_OINTMENT | CUTANEOUS | Status: DC | PRN

## 2014-08-11 MED ORDER — BUPIVACAINE HCL (PF) 0.5 % IJ SOLN
INTRAMUSCULAR | Status: AC
  Filled 2014-08-11: qty 30

## 2014-08-11 MED ORDER — OXYTOCIN 10 UNIT/ML IJ SOLN
INTRAMUSCULAR | Status: AC
  Filled 2014-08-11: qty 4

## 2014-08-11 MED ORDER — DIPHENHYDRAMINE HCL 50 MG/ML IJ SOLN: 12.5000 mg | INTRAMUSCULAR | Status: DC | PRN

## 2014-08-11 MED ORDER — NALBUPHINE HCL 10 MG/ML IJ SOLN
5.0000 mg | Freq: Once | INTRAMUSCULAR | Status: AC | PRN
  Filled 2014-08-11: qty 1

## 2014-08-11 MED ORDER — MORPHINE SULFATE (PF) 0.5 MG/ML IJ SOLN
INTRAMUSCULAR | Status: DC | PRN
  Administered 2014-08-11: .2 ug via INTRATHECAL

## 2014-08-11 MED ORDER — IBUPROFEN 600 MG PO TABS
600.0000 mg | ORAL_TABLET | Freq: Four times a day (QID) | ORAL | Status: DC
  Administered 2014-08-12 – 2014-08-14 (×9): 600 mg via ORAL
  Filled 2014-08-11 (×11): qty 1

## 2014-08-11 MED ORDER — CEFAZOLIN SODIUM-DEXTROSE 2-3 GM-% IV SOLR
2.0000 g | INTRAVENOUS | Status: AC
  Administered 2014-08-11: 2 g via INTRAVENOUS
  Filled 2014-08-11: qty 50

## 2014-08-11 MED ORDER — DIPHENHYDRAMINE HCL 25 MG PO CAPS
25.0000 mg | ORAL_CAPSULE | Freq: Four times a day (QID) | ORAL | Status: DC | PRN
  Administered 2014-08-12: 25 mg via ORAL
  Filled 2014-08-11: qty 1

## 2014-08-11 MED ORDER — ZOLPIDEM TARTRATE 5 MG PO TABS: 5.0000 mg | ORAL_TABLET | Freq: Every evening | ORAL | Status: DC | PRN

## 2014-08-11 MED ORDER — KETOROLAC TROMETHAMINE 30 MG/ML IJ SOLN
30.0000 mg | Freq: Four times a day (QID) | INTRAMUSCULAR | Status: AC | PRN
  Administered 2014-08-11: 30 mg via INTRAMUSCULAR

## 2014-08-11 MED ORDER — FENTANYL CITRATE (PF) 100 MCG/2ML IJ SOLN
INTRAMUSCULAR | Status: AC
  Filled 2014-08-11: qty 2

## 2014-08-11 MED ORDER — CITRIC ACID-SODIUM CITRATE 334-500 MG/5ML PO SOLN
30.0000 mL | Freq: Once | ORAL | Status: AC
  Administered 2014-08-11: 30 mL via ORAL
  Filled 2014-08-11: qty 15

## 2014-08-11 SURGICAL SUPPLY — 35 items
BARRIER ADHS 3X4 INTERCEED (GAUZE/BANDAGES/DRESSINGS) IMPLANT
BENZOIN TINCTURE PRP APPL 2/3 (GAUZE/BANDAGES/DRESSINGS) ×3 IMPLANT
CLAMP CORD UMBIL (MISCELLANEOUS) IMPLANT
CLOSURE WOUND 1/2 X4 (GAUZE/BANDAGES/DRESSINGS) ×1
CLOTH BEACON ORANGE TIMEOUT ST (SAFETY) ×3 IMPLANT
DRAPE SHEET LG 3/4 BI-LAMINATE (DRAPES) IMPLANT
DRSG OPSITE POSTOP 4X10 (GAUZE/BANDAGES/DRESSINGS) ×3 IMPLANT
DURAPREP 26ML APPLICATOR (WOUND CARE) ×3 IMPLANT
ELECT REM PT RETURN 9FT ADLT (ELECTROSURGICAL) ×3
ELECTRODE REM PT RTRN 9FT ADLT (ELECTROSURGICAL) ×1 IMPLANT
EXTRACTOR VACUUM KIWI (MISCELLANEOUS) IMPLANT
GAUZE SPONGE 4X4 12PLY STRL (GAUZE/BANDAGES/DRESSINGS) ×3 IMPLANT
GLOVE BIO SURGEON STRL SZ 6.5 (GLOVE) ×2 IMPLANT
GLOVE BIO SURGEONS STRL SZ 6.5 (GLOVE) ×1
GLOVE BIOGEL PI IND STRL 7.0 (GLOVE) ×1 IMPLANT
GLOVE BIOGEL PI INDICATOR 7.0 (GLOVE) ×2
GOWN STRL REUS W/TWL LRG LVL3 (GOWN DISPOSABLE) ×6 IMPLANT
KIT ABG SYR 3ML LUER SLIP (SYRINGE) IMPLANT
NEEDLE HYPO 22GX1.5 SAFETY (NEEDLE) IMPLANT
NEEDLE HYPO 25X5/8 SAFETYGLIDE (NEEDLE) IMPLANT
NS IRRIG 1000ML POUR BTL (IV SOLUTION) ×3 IMPLANT
PACK C SECTION WH (CUSTOM PROCEDURE TRAY) ×3 IMPLANT
PAD ABD 7.5X8 STRL (GAUZE/BANDAGES/DRESSINGS) ×3 IMPLANT
PAD OB MATERNITY 4.3X12.25 (PERSONAL CARE ITEMS) ×3 IMPLANT
RETRACTOR WND ALEXIS 25 LRG (MISCELLANEOUS) ×1 IMPLANT
RTRCTR WOUND ALEXIS 25CM LRG (MISCELLANEOUS) ×3
STRIP CLOSURE SKIN 1/2X4 (GAUZE/BANDAGES/DRESSINGS) ×2 IMPLANT
SUT VIC AB 0 CT1 36 (SUTURE) ×18 IMPLANT
SUT VIC AB 2-0 CT1 27 (SUTURE) ×2
SUT VIC AB 2-0 CT1 TAPERPNT 27 (SUTURE) ×1 IMPLANT
SUT VIC AB 4-0 PS2 27 (SUTURE) ×3 IMPLANT
SYR CONTROL 10ML LL (SYRINGE) IMPLANT
TAPE CLOTH SURG 4X10 WHT LF (GAUZE/BANDAGES/DRESSINGS) ×3 IMPLANT
TOWEL OR 17X24 6PK STRL BLUE (TOWEL DISPOSABLE) ×3 IMPLANT
TRAY FOLEY CATH SILVER 14FR (SET/KITS/TRAYS/PACK) IMPLANT

## 2014-08-11 NOTE — MAU Note (Signed)
Pt states has been trickling clear fluid for past 2 days. After losing mucus plug this am, pt has had increased discharge, especially when voiding. No bleeding.

## 2014-08-11 NOTE — Telephone Encounter (Signed)
Patient called and left message stating she keeps having stuff run down her legs after she uses the bathroom. Per review patient spoke to triage nurse yesterday and had questions answered. Called patient, no answer- left message stating we are trying to return your phone call. We see where you spoke to a triage nurse who answered your questions. If you need further assistance or still have questions please call us back at the clinics

## 2014-08-11 NOTE — Progress Notes (Signed)
Patient reported ants in original room, 121.  Has been moved to room 119.

## 2014-08-11 NOTE — Progress Notes (Signed)
Dr. Loreta AveAcosta notified pt is grossly ruptured, will come see pt.

## 2014-08-11 NOTE — Anesthesia Preprocedure Evaluation (Signed)
Anesthesia Evaluation  Patient identified by MRN, date of birth, ID band Patient awake    Reviewed: Allergy & Precautions, Patient's Chart, lab work & pertinent test results  Airway Mallampati: III  TM Distance: >3 FB Neck ROM: Full    Dental no notable dental hx. (+) Teeth Intact   Pulmonary neg pulmonary ROS, Current Smoker,  breath sounds clear to auscultation  Pulmonary exam normal       Cardiovascular negative cardio ROS Normal cardiovascular examRhythm:Regular Rate:Normal     Neuro/Psych negative neurological ROS  negative psych ROS   GI/Hepatic GERD-  ,(+)     substance abuse  marijuana use,   Endo/Other  Morbid obesity  Renal/GU negative Renal ROS  negative genitourinary   Musculoskeletal negative musculoskeletal ROS (+)   Abdominal (+) + obese,   Peds  Hematology   Anesthesia Other Findings   Reproductive/Obstetrics (+) Pregnancy 36 weeks PTL  Previous C/section                             Anesthesia Physical Anesthesia Plan  ASA: III and emergent  Anesthesia Plan: Spinal   Post-op Pain Management:    Induction:   Airway Management Planned: Natural Airway  Additional Equipment:   Intra-op Plan:   Post-operative Plan: Extubation in OR  Informed Consent: I have reviewed the patients History and Physical, chart, labs and discussed the procedure including the risks, benefits and alternatives for the proposed anesthesia with the patient or authorized representative who has indicated his/her understanding and acceptance.   Dental advisory given  Plan Discussed with: CRNA, Anesthesiologist and Surgeon  Anesthesia Plan Comments:         Anesthesia Quick Evaluation

## 2014-08-11 NOTE — Telephone Encounter (Signed)
Patient called clinic-- message muffled and unable to hear reason for call. In hospital today-- appears to have PTL and c-section. Left message stating she may call clinic with any questions assuming they have not already been answered.

## 2014-08-11 NOTE — Transfer of Care (Signed)
Immediate Anesthesia Transfer of Care Note  Patient: Tami Everett  Procedure(s) Performed: Procedure(s): CESAREAN SECTION (N/A)  Patient Location: PACU  Anesthesia Type:Spinal  Level of Consciousness: awake, alert  and oriented  Airway & Oxygen Therapy: Patient Spontanous Breathing  Post-op Assessment: Report given to RN and Post -op Vital signs reviewed and stable  Post vital signs: Reviewed, stable and unstable  Last Vitals:  Filed Vitals:   08/11/14 0949  BP: 123/70  Pulse: 73  Temp: 36.7 C  Resp: 16    Complications: No apparent anesthesia complications

## 2014-08-11 NOTE — ED Notes (Signed)
Report called to Sharen Hintaroline Brewer, RN.  Pt to MAU for evaluation.

## 2014-08-11 NOTE — Anesthesia Procedure Notes (Signed)
Spinal Patient location during procedure: OR Start time: 08/11/2014 11:52 AM Staffing Anesthesiologist: Mal Amabile Performed by: anesthesiologist  Preanesthetic Checklist Completed: patient identified, site marked, surgical consent, pre-op evaluation, timeout performed, IV checked, risks and benefits discussed and monitors and equipment checked Spinal Block Patient position: sitting Prep: site prepped and draped and DuraPrep Patient monitoring: heart rate, cardiac monitor, continuous pulse ox and blood pressure Approach: midline Location: L3-4 Injection technique: single-shot Needle Needle type: Sprotte  Needle gauge: 24 G Needle length: 9 cm Assessment Sensory level: T4 Additional Notes Patient tolerated procedure well. Adequate sensory level.

## 2014-08-11 NOTE — Op Note (Signed)
Tami RidgesViola S Everett PROCEDURE DATE: 08/11/2014  PREOPERATIVE DIAGNOSES: Intrauterine pregnancy at 285w6d weeks gestation; preterm premature rupture of membranes, previous cesarean section, history of intrauterine fetal demise, shortened cervix, polyhydramnios  POSTOPERATIVE DIAGNOSES: The same  PROCEDURE: Repeat Low Transverse Cesarean Section  SURGEON:  Dr. Scheryl DarterJames Arnold  ASSISTANT:  Dr. Fredirick LatheKristy Zaid Tomes  ANESTHESIOLOGIST: Dr. Mal AmabileMichael Foster  INDICATIONS: Tami Everett is a 24 y.o. [phone] at 215w6d here for cesarean section secondary to the indications listed under preoperative diagnoses; please see preoperative note for further details.  The risks of cesarean section were discussed with the patient including but were not limited to: bleeding which may require transfusion or reoperation; infection which may require antibiotics; injury to bowel, bladder, ureters or other surrounding organs; injury to the fetus; need for additional procedures including hysterectomy in the event of a life-threatening hemorrhage; placental abnormalities wth subsequent pregnancies, incisional problems, thromboembolic phenomenon and other postoperative/anesthesia complications.   The patient concurred with the proposed plan, giving informed written consent for the procedure.    FINDINGS:  Viable female infant in cephalic presentation.   APGAR: 7, 8; weight 6 lb 8.8 oz (2971 g).  Clear amniotic fluid.  Intact placenta, three vessel cord.  Normal uterus, fallopian tubes and ovaries bilaterally.  ANESTHESIA: Spinal INTRAVENOUS FLUIDS: 2000 ml ESTIMATED BLOOD LOSS: 700 ml URINE OUTPUT:  150 ml SPECIMENS: Placenta sent to L&D COMPLICATIONS: None immediate  PROCEDURE IN DETAIL:  The patient preoperatively received intravenous antibiotics and had sequential compression devices applied to her lower extremities.  She was then taken to the operating room where spinal anesthesia was administered and was found to be adequate. She was  then placed in a dorsal supine position with a leftward tilt, and prepped and draped in a sterile manner.  A foley catheter was placed into her bladder and attached to constant gravity.  After an adequate timeout was performed, a Pfannenstiel skin incision was made with scalpel and carried through to the underlying layer of fascia. The fascia was incised in the midline, and this incision was extended bilaterally using the Mayo scissors.  Kocher clamps were applied to the superior aspect of the fascial incision and the underlying rectus muscles were dissected off bluntly. A similar process was carried out on the inferior aspect of the fascial incision. The rectus muscles were separated in the midline bluntly and the peritoneum was entered bluntly. Attention was turned to the lower uterine segment where a low transverse hysterotomy was made with a scalpel and extended bilaterally bluntly.  The infant was successfully delivered, the cord was clamped and cut and the infant was handed over to awaiting neonatology team. Uterine massage was then administered, and the placenta delivered intact with a three-vessel cord. The uterus was then cleared of clot and debris.  The hysterotomy was closed with 0 Vicryl in a running locked fashion, and an imbricating layer was also placed with 0 Vicryl. The pelvis was cleared of all clot and debris. Hemostasis was confirmed on all surfaces.  The peritoneum and the muscles were reapproximated using 0 Vicryl interrupted stitches. The fascia was then closed using 0 Vicryl running fashion.  The skin was closed with a 4-0 Vicryl subcuticular stitch. The patient tolerated the procedure well. Sponge, lap, instrument and needle counts were correct x 2.  She was taken to the recovery room in stable condition.   Tami Everett ROCIO, MD  [facility], [facility]

## 2014-08-11 NOTE — Anesthesia Postprocedure Evaluation (Signed)
  Anesthesia Post-op Note  Patient: Tami Everett  Procedure(s) Performed: Procedure(s): CESAREAN SECTION (N/A)  Patient Location: PACU  Anesthesia Type:Spinal  Level of Consciousness: awake, alert  and oriented  Airway and Oxygen Therapy: Patient Spontanous Breathing  Post-op Pain: none  Post-op Assessment: Post-op Vital signs reviewed, Patient's Cardiovascular Status Stable, Respiratory Function Stable, Patent Airway, No signs of Nausea or vomiting, Pain level controlled, No headache, No backache, Spinal receding and Patient able to bend at knees LLE Motor Response: Purposeful movement LLE Sensation: Tingling RLE Motor Response: Purposeful movement RLE Sensation: Tingling L Sensory Level: S1-Sole of foot, small toes R Sensory Level: S1-Sole of foot, small toes  Post-op Vital Signs: Reviewed and stable  Last Vitals:  Filed Vitals:   08/11/14 1330  BP: 110/67  Pulse: 69  Temp:   Resp: 16    Complications: No apparent anesthesia complications

## 2014-08-11 NOTE — H&P (Signed)
LABOR ADMISSION HISTORY AND PHYSICAL  Tami Everett is a 24 y.o. female G54P1101 with IUP at [redacted]w[redacted]d by 11w sono presenting for with rupture of membranes. She reports +FMs, No LOF, no VB, no blurry vision, headaches or peripheral edema, and RUQ pain.  She plans on bottle feeding. She is undecided for birth control.  Dating: By [redacted]w[redacted]d dating sono not consistent with LMP --->  Estimated Date of Delivery: 09/02/14   Prenatal History/Complications:  Past Medical History: Past Medical History  Diagnosis Date  . Medical history non-contributory     Past Surgical History: Past Surgical History  Procedure Laterality Date  . Dilation and curettage of uterus    . Cesarean section      Obstetrical History: OB History    Gravida Para Term Preterm AB TAB SAB Ectopic Multiple Living   Social History: History   Social History  . Marital Status: Single    Spouse Name: N/A  . Number of Children: N/A  . Years of Education: N/A   Social History Main Topics  . Smoking status: Current Every Day Smoker -- 0.10 packs/day    Types: Cigarettes  . Smokeless tobacco: Never Used  . Alcohol Use: No  . Drug Use: Yes    Special: Marijuana     Comment: quit when found out pregnant 11/2013  . Sexual Activity: Yes   Other Topics Concern  . None   Social History Narrative    Family History: Family History  Problem Relation Age of Onset  . Hypertension Father   . COPD Father   . Heart disease Brother     Allergies: No Known Allergies  Prescriptions prior to admission  Medication Sig Dispense Refill Last Dose  . acetaminophen-codeine (TYLENOL #3) 300-30 MG per tablet Take 1 tablet by mouth every 6 (six) hours as needed. for pain  0 Not Taking  . miconazole (MONISTAT 7) 2 % vaginal cream Place 1 Applicatorful vaginally at bedtime. Apply for seven nights (Patient not taking: Reported on 08/07/2014) 30 g 2 Not Taking  . Prenatal Vit-Fe Fumarate-FA (PNV PRENATAL PLUS  MULTIVITAMIN) 27-1 MG TABS Take 1 tablet by mouth daily. 30 tablet 12 Taking     Review of Systems   All systems reviewed and negative except as stated in HPI  Blood pressure 123/70, pulse 73, temperature 98 F (36.7 C), temperature source Oral, resp. rate 16, height 5' 1.5" (1.562 m), weight 203 lb 12 oz (92.42 kg), last menstrual period 11/05/2013, SpO2 100 %. General appearance: alert and cooperative Lungs: clear to auscultation bilaterally Heart: regular rate and rhythm Abdomen: soft, non-tender; bowel sounds normal Extremities: Homans sign is negative, no sign of DVT DTR's 2+  Fetal monitoringBaseline: 140 bpm, Variability: Good {> 6 bpm), Accelerations: nonreactive and Decelerations: Absent Uterine activity q2-9min, irregular      Prenatal labs: ABO, Rh: --/--/A POS, A POS (03/25 1540) Antibody: NEG (03/25 1540) Rubella:   RPR: NON REAC (04/28 1410)  HBsAg: Negative (12/29 0000)  HIV: NONREACTIVE (04/28 1410)  GBS:    1 hr Glucola 127 Genetic screening  NT/1st screen/MSAFP normal Anatomy US normal  Prenatal Transfer Tool  Maternal Diabetes: No Genetic Screening: Normal Maternal Ultrasounds/Referrals: Normal Fetal Ultrasounds or other Referrals:  None Maternal Substance Abuse:  No Significant Maternal Medications:  None Significant Maternal Lab Results: Lab values include: Other:   Fetal testing: Weekly BPPs from 28 wk; Weekly AFI and  NST biweekly starting 32 wk   Cl[facility] Prenatal Labs  Dating  by LMP Blood type: A/Positive/-- (12/22 0000)   Genetic Screen NT normal, 1st screen normal, MSAFP ordered - normal Antibody:Negative (12/22 0000)  Anatomic US  Nml @ 19 wks, but poor view > nml all @23  wks Rubella: Immune (12/29 0000)  GTT Third trimester: 127 RPR: Nonreactive (12/22 0000)   Flu vaccine  Declined HBsAg: Negative (12/29 0000)   TDaP vaccine  06/12/14                                          HIV: Non-reactive (12/29 0000)   GBS                                                GBS:   Contraception  depo Pap: Negative 01/2014  Baby Food  bottle   Circumcision Girl - Janetta   Pediatrician  Uncertain   Support Person  Alinda Moneyony (FO[facility])      Results for orders placed or performed during the hospital encounter of 08/11/14 (from the past 24 hour(s))  Urinalysis, Routine w reflex microscopic ([facility])   Collection Time: 08/11/14  9:40 AM  Result Value Ref Range   Color, Urine YELLOW YELLOW   APPearance CLEAR CLEAR   Specific Gravity, Urine 1.020 1.005 - 1.030   pH 6.5 5.0 - 8.0   Glucose, UA NEGATIVE NEGATIVE mg/dL   Hgb urine dipstick NEGATIVE NEGATIVE   Bilirubin Urine NEGATIVE NEGATIVE   Ketones, ur NEGATIVE NEGATIVE mg/dL   Protein, ur NEGATIVE NEGATIVE mg/dL   Urobilinogen, UA 1.0 0.0 - 1.0 mg/dL   Nitrite NEGATIVE NEGATIVE   Leukocytes, UA NEGATIVE NEGATIVE  Fern Test   Collection Time: 08/11/14 10:05 AM  Result Value Ref Range   POCT Fern Test      Patient Active Problem List   Diagnosis Date Noted  . [redacted] weeks gestation of pregnancy   . Cervical insufficiency during pregnancy in third trimester, antepartum   . Fetal cardiac anomaly complicating pregnancy, antepartum   . History of preterm delivery   . Supervision of high-risk pregnancy 07/31/2014  . [redacted] weeks gestation of pregnancy   . Polyhydramnios   . 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 Pregnancy with poor obstetric history   . Polyhydramnios in third trimester, antepartum complication   . Abnormal fetal ultrasound 06/16/2014  . Drug use complicating pregnancy in second trimester   . Joslyn cervix in second trimester, antepartum 05/09/2014  . Previous preterm delivery, antepartum   . History of intrauterine fetal death, currently pregnant 02/13/2014  . Cigarette smoker 02/13/2014  . Marijuana use 02/13/2014  . History of cesarean delivery, currently pregnant 02/13/2014    Assessment: [name] is a 24 y.o. G3P1101 at 6328w6d here with rupture  of membranes  #Labor:declines TOL, requests rLTCS #Pain: Spinal anesthesia #FWB: Cat I #ID:  GBS unk, as pt currently being prepped for c/s will receive ancef #MOF: bottle #MOC:undecided  Pessary removed, 2/80/-2.  The risks of cesarean section discussed with the patient included but were not limited to: bleeding which may require transfusion or reoperation; infection which may require antibiotics; injury to bowel, bladder, ureters or other surrounding organs;  injury to the fetus; need for additional procedures including hysterectomy in the event of a life-threatening hemorrhage; placental abnormalities wth subsequent pregnancies, incisional problems, thromboembolic phenomenon and other postoperative/anesthesia complications. The patient concurred with the proposed plan, giving informed written consent for the procedure.   Patient will remain NPO for procedure. Anesthesia and OR aware. Preoperative prophylactic antibiotics and SCDs ordered on call to the OR.  To OR when ready.     Treazure Nery ROCIO 08/11/2014, 10:49 AM

## 2014-08-12 ENCOUNTER — Encounter: Payer: Self-pay | Admitting: General Practice

## 2014-08-12 ENCOUNTER — Encounter (HOSPITAL_COMMUNITY): Payer: Self-pay | Admitting: Obstetrics & Gynecology

## 2014-08-12 LAB — RPR: RPR Ser Ql: NONREACTIVE

## 2014-08-12 LAB — HIV ANTIBODY (ROUTINE TESTING W REFLEX): HIV Screen 4th Generation wRfx: NONREACTIVE

## 2014-08-12 NOTE — Addendum Note (Signed)
Addendum  created 08/12/14 1004 by Shanon PayorSuzanne M Nurah Petrides, CRNA   Modules edited: Notes Section   Notes Section:  File: [id number]

## 2014-08-12 NOTE — Anesthesia Postprocedure Evaluation (Signed)
  Anesthesia Post-op Note  Patient: Cherylynn RidgesViola S Doan  Procedure(s) Performed: Procedure(s): CESAREAN SECTION (N/A)  Patient Location: Mother/Baby  Anesthesia Type:Spinal  Level of Consciousness: awake, alert  and oriented  Airway and Oxygen Therapy: Patient Spontanous Breathing  Post-op Pain: none  Post-op Assessment: Post-op Vital signs reviewed, Patient's Cardiovascular Status Stable, Respiratory Function Stable, No signs of Nausea or vomiting, Pain level controlled, No headache and No backache  Post-op Vital Signs: Reviewed and stable  Last Vitals:  Filed Vitals:   08/12/14 0841  BP: 124/74  Pulse: 60  Temp: 36.7 C  Resp: 18    Complications: No apparent anesthesia complications

## 2014-08-12 NOTE — Progress Notes (Signed)
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   Referral Source:  Central Nursery   Address:  [address] [place],  [address]  Phone number:  [phone]   Household Members:  Minor Children (26 year old son), Significant Other   Natural Supports (not living in the home):  Immediate Family, Extended Family   Professional Supports: None   Employment: Full-time   Type of Work: [employer]   Education:    N/A  Financial Resources:  Medicaid   Other Resources:  Food Stamps , WIC   Cultural/Religious Considerations Which May Impact Care:  None reported  Strengths:    Home prepared for the infant, Open and honest about substance use, Strong family support  Risk Factors/Current Problems:   1)Substance Use: MOB's prenatal records report history of marijuana use but quit in October 2015 with +UPT.  During CSW assessment, MOB reported occasional use during the pregnancy to assist with nausea. MOB was unable to recall date of last use, but shared that she is surprised that the infant has a negative UDS.    Cognitive State:  Able to Concentrate , Alert , Goal Oriented , Linear Thinking , Insightful    Mood/Affect:  Animated, Happy , Interested    CSW Assessment:  CSW received request for consult due to MOB presenting with a history of THC use in pregnancy.  MOB provided consent for the FOB's sister to remain in the room during the assessment.  MOB presented as easily engaged and receptive to the visit. She  displayed a full range in affect, presented in a pleasant mood, and put forth effort to ambulate around the room.  MOB presented as open and honest about her substance use during the pregnancy.  Per MOB, she is feeling much "better" now that the infant has been born. She stated that it was emotionally difficult for her toward the end of the pregnancy since she could not be as active and work as much as she wanted to. She shared that it is important for her to feel like she is contributing to the household, and discussed the frustration she felt when she perceived that she was not able to do as much as she wanted to.  MOB stated that she recognizes that these were Overfelt term stressors, and smiled as she reflected upon her ability to overcome these stressors during the pregnancy. She endorsed having a strong support system which she utilized during the pregnancy, and will continue to utilize in the postpartum period. She stated that she is hoping to return to work as soon as possible since she feels an obligation to provide for the family.   MOB discussed normative range in emotions that accompany a pregnancy. She stated that she noted crying over "small things" and over situations that normally would not make her cry.  MOB acknowledged that these symptoms may continue due to the hormone changes in the baby blues.  MOB shared that she primarily feels "happy" and "excited", but also stated that these emotions   are opposed by feelings of sadness/grief since today would have been her daughter's 787th birthday; however, this infant was an IUFD. She presented as coping well with this loss, and she reported feeling blessed with her children that are alive, but acknowledged how this continues to impact her on a daily basis. MOB presented as attentive as CSW continued to provide education on perinatal mood and anxiety disorders. She agreed to contact her OB if she notes symptoms since symptoms can be effectively treated.    Per MOB, she has a history of THC use.  She stated that she felt torn during the pregnancy since she knew that she should not smoke during the pregnancy, but felt that it was the only way she could eat.  MOB was a vague historian about her exact use and last use, but stated that her last use was "recent".  MOB verbalized understanding of the hospital drug screen policy, and denied any other substance use during the pregnancy. She shared an awareness that the infant's UDS is negative, but acknowledged that MDS may be positive due to frequency and last use.  MOB denied questions or concerns about CPS becoming involved if the drug screen is positive. She shared belief that it is important for her to be open and honest about her substance use when asked.  CSW acknowledged and validated this strength of MOB.  MOB denied additional questions, concerns, or needs at this time. She expressed appreciation for the visit, and agreed to contact CSW if needs arise.  CSW Plan/Description:   1)Patient/Family Education: Perinatal mood and anxiety disorders, hospital drug screen policy 2)CSW to monitor infant's MDS and will notify CPS if the drug screen is positive.  MOB is aware that CPS will be contacted if there is a positive drug screen 3)No Further Intervention Required/No Barriers to Discharge    Pervis HockingVenning, Kaden Dunkel N, LCSW 08/12/2014, 10:09 AM

## 2014-08-12 NOTE — Progress Notes (Signed)
Post Partum Day 1 Subjective: Repeat LTCS; doind well; Ambulating, voiding, tolerating regular diet  Objective: Blood pressure 123/78, pulse 60, temperature 97.9 F (36.6 C), temperature source Oral, resp. rate 18, height 5' 1.5" (1.562 m), weight 92.42 kg (203 lb 12 oz), last menstrual period 11/05/2013, SpO2 100 %, unknown if currently breastfeeding.  Physical Exam:  General: no distress Lochia: appropriate Uterine Fundus: firm Incision: no significant drainage DVT Evaluation: No evidence of DVT seen on physical exam.   Recent Labs  08/11/14 1105  HGB 10.2*  HCT 30.7*    Assessment/Plan: Plan for discharge tomorrow   LOS: 1 day   Tami Everett 08/12/2014, 8:25 AM

## 2014-08-13 LAB — BIRTH TISSUE RECOVERY COLLECTION (PLACENTA DONATION)

## 2014-08-13 NOTE — Progress Notes (Signed)
UR chart review completed.  

## 2014-08-13 NOTE — Progress Notes (Signed)
Post Partum Day 2 Subjective: no complaints, up ad lib, voiding, tolerating PO and + flatus  Objective: Blood pressure 116/68, pulse 78, temperature 97.9 F (36.6 C), temperature source Oral, resp. rate 18, height 5' 1.5" (1.562 m), weight 92.42 kg (203 lb 12 oz), last menstrual period 11/05/2013, SpO2 100 %, unknown if currently breastfeeding.  Physical Exam:  General: alert, cooperative and no distress Lochia: appropriate Uterine Fundus: firm Incision: healing well DVT Evaluation: No evidence of DVT seen on physical exam.   Recent Labs  08/11/14 1105  HGB 10.2*  HCT 30.7*    Assessment/Plan: Plan for discharge tomorrow   LOS: 2 days   Tawnya CrookHogan, Heather Donovan 08/13/2014, 8:00 AM

## 2014-08-14 ENCOUNTER — Other Ambulatory Visit (HOSPITAL_COMMUNITY): Payer: Medicaid Other

## 2014-08-14 ENCOUNTER — Encounter: Payer: Medicaid Other | Admitting: Obstetrics & Gynecology

## 2014-08-14 ENCOUNTER — Ambulatory Visit (HOSPITAL_COMMUNITY): Payer: Medicaid Other

## 2014-08-14 MED ORDER — IBUPROFEN 600 MG PO TABS: 600.0000 mg | ORAL_TABLET | Freq: Four times a day (QID) | ORAL | Status: DC | PRN

## 2014-08-14 MED ORDER — NORGESTIMATE-ETH ESTRADIOL 0.25-35 MG-MCG PO TABS: 1.0000 | ORAL_TABLET | Freq: Every day | ORAL | Status: DC

## 2014-08-14 MED ORDER — OXYCODONE-ACETAMINOPHEN 5-325 MG PO TABS: 1.0000 | ORAL_TABLET | ORAL | Status: DC | PRN

## 2014-08-14 MED ORDER — DOCUSATE SODIUM 100 MG PO CAPS: 100.0000 mg | ORAL_CAPSULE | Freq: Two times a day (BID) | ORAL | Status: DC | PRN

## 2014-08-14 NOTE — Discharge Summary (Signed)
Obstetric Discharge Summary Reason for Admission: rupture of membranes Prenatal Procedures: NST and ultrasound Intrapartum Procedures: cesarean: low cervical, transverse Postpartum Procedures: none Complications-Operative and Postpartum: none   Hospital Course:  Active Problems:   Preterm premature rupture of membranes (PPROM) with unknown onset of labor   [name] is a 24 y.o. [id number] s/p rLTCS.  Patient was admitted with PROM.  She has postpartum course that was uncomplicated including no problems with ambulating, PO intake, urination, pain, or bleeding. The pt feels ready to go home and  will be discharged with outpatient follow-up.   Today: No acute events overnight.  Pt denies problems with ambulating, voiding or po intake.  She denies nausea or vomiting.  Pain is well controlled.  She has had flatus. She has not had bowel movement.  Lochia Small.  Plan for birth control is  oral contraceptives (estrogen/progesterone).  Method of Feeding: Bottle.   Physical Exam:  General: alert, cooperative and no distress Lochia: appropriate Uterine Fundus: firm Incision: no significant drainage DVT Evaluation: No evidence of DVT seen on physical exam.  H/H: Lab Results  Component Value Date/Time   HGB 10.2* 08/11/2014 11:05 AM   HGB 11.0 02/04/2014   HCT 30.7* 08/11/2014 11:05 AM   HCT 34 02/04/2014    Discharge Diagnoses: Preterm Pregnancy-delivered  Discharge Information: Date: 08/14/2014 Activity: pelvic rest Diet: routine  Medications: PNV, Ibuprofen, Colace and Percocet Breast feeding:  No: Formula Condition: stable Instructions: refer to handout Discharge to: home   Discharge Instructions    Increase activity slowly    Complete by:  As directed             Medication List    STOP taking these medications        acetaminophen-codeine 300-30 MG per tablet  Commonly known as:  TYLENOL #3     miconazole 2 % vaginal cream  Commonly known as:  MONISTAT 7       TAKE these medications        docusate sodium 100 MG capsule  Commonly known as:  COLACE  Take 1 capsule (100 mg total) by mouth 2 (two) times daily as needed.     ibuprofen 600 MG tablet  Commonly known as:  ADVIL,MOTRIN  Take 1 tablet (600 mg total) by mouth every 6 (six) hours as needed for mild pain.     norgestimate-ethinyl estradiol 0.25-35 MG-MCG tablet  Commonly known as:  SPRINTEC 28  Take 1 tablet by mouth daily.  Start taking on:  09/09/2014     oxyCODONE-acetaminophen 5-325 MG per tablet  Commonly known as:  PERCOCET/ROXICET  Take 1 tablet by mouth every 4 (four) hours as needed (for pain scale 4-7).     PNV PRENATAL PLUS MULTIVITAMIN 27-1 MG Tabs  Take 1 tablet by mouth daily.        Caryl AdaJazma Phelps, DO 08/14/2014, 8:43 AM PGY-1, [facility]

## 2014-08-19 ENCOUNTER — Encounter (HOSPITAL_COMMUNITY): Payer: Self-pay | Admitting: Anesthesiology

## 2014-08-21 ENCOUNTER — Other Ambulatory Visit (HOSPITAL_COMMUNITY): Payer: Medicaid Other

## 2014-08-22 ENCOUNTER — Inpatient Hospital Stay (HOSPITAL_COMMUNITY): Admission: RE | Admit: 2014-08-22 | Payer: Medicaid Other | Source: Ambulatory Visit

## 2014-08-26 ENCOUNTER — Encounter (HOSPITAL_COMMUNITY): Admission: RE | Payer: Self-pay | Source: Ambulatory Visit

## 2014-08-26 ENCOUNTER — Inpatient Hospital Stay (HOSPITAL_COMMUNITY)
Admission: RE | Admit: 2014-08-26 | Payer: Medicaid Other | Source: Ambulatory Visit | Admitting: Obstetrics and Gynecology

## 2014-08-26 SURGERY — Surgical Case
Anesthesia: Regional | Site: Abdomen

## 2014-09-22 ENCOUNTER — Ambulatory Visit (INDEPENDENT_AMBULATORY_CARE_PROVIDER_SITE_OTHER): Payer: Medicaid Other | Admitting: Obstetrics & Gynecology

## 2014-09-22 ENCOUNTER — Encounter: Payer: Self-pay | Admitting: Family Medicine

## 2014-09-22 ENCOUNTER — Encounter: Payer: Self-pay | Admitting: *Deleted

## 2014-09-22 NOTE — Patient Instructions (Signed)
Laparoscopic Tubal Ligation Laparoscopic tubal ligation is a procedure that closes the fallopian tubes at a time other than right after childbirth. By closing the fallopian tubes, the eggs that are released from the ovaries cannot enter the uterus and sperm cannot reach the egg. Tubal ligation is also known as getting your "tubes tied." Tubal ligation is done so you will not be able to get pregnant or have a baby.  Although this procedure may be reversed, it should be considered permanent and irreversible. If you want to have future pregnancies, you should not have this procedure.  LET YOUR CAREGIVER KNOW ABOUT:  Allergies to food or medicine.  Medicines taken, including vitamins, herbs, eyedrops, over-the-counter medicines, and creams.  Use of steroids (by mouth or creams).  Previous problems with numbing medicines.  History of bleeding problems or blood clots.  Any recent colds or infections.  Previous surgery.  Other health problems, including diabetes and kidney problems.  Possibility of pregnancy, if this applies.  Any past pregnancies. RISKS AND COMPLICATIONS   Infection.  Bleeding.  Injury to surrounding organs.  Anesthetic side effects.  Failure of the procedure.  Ectopic pregnancy.  Future regret about having the procedure done. BEFORE THE PROCEDURE  Do not take aspirin or blood thinners a week before the procedure or as directed. This can cause bleeding.  Do not eat or drink anything 6 to 8 hours before the procedure. PROCEDURE   You may be given a medicine to help you relax (sedative) before the procedure. You will be given a medicine to make you sleep (general anesthetic) during the procedure.  A tube will be put down your throat to help your breath while under general anesthesia.  Two small cuts (incisions) are made in the lower abdominal area and near the belly button.  Your abdominal area will be inflated with a safe gas (carbon dioxide). This helps  give the surgeon room to operate, visualize, and helps the surgeon avoid other organs.  A thin, lighted tube (laparoscope) with a camera attached is inserted into your abdomen through one of the incisions near the belly button. Other small instruments are also inserted through the other abdominal incision.  The fallopian tubes are located and are either blocked with a ring, clip, or are burned (cauterized).  After the fallopian tubes are blocked, the gas is released from the abdomen.  The incisions will be closed with stitches (sutures), and a bandage may be placed over the incisions. AFTER THE PROCEDURE   You will rest in a recovery room for 1--4 hours until you are stable and doing well.  You will also have some mild abdominal discomfort for 3--7 days. You will be given pain medicine to ease any discomfort.  As long as there are no problems, you may be allowed to go home. Someone will need to drive you home and be with you for at least 24 hours once home.  You may have some mild discomfort in the throat. This is from the tube placed in your throat while you were sleeping.  You may experience discomfort in the shoulder area from some trapped air between the liver and diaphragm. This sensation is normal and will slowly go away on its own. Document Released: 05/09/2000 Document Revised: 08/02/2011 Document Reviewed: 05/14/2011 ExitCare Patient Information 2015 [employer]. This information is not intended to replace advice given to you by your health care provider. Make sure you discuss any questions you have with your health care provider.  

## 2014-09-22 NOTE — Progress Notes (Signed)
Patient ID: Boston Service, female   DOB: Jul 02, 1990, 24 y.o.   MRN: [id number] Subjective:wants to schedule BTL     Tami Everett is a 24 y.o. female who presents for a postpartum visit. She is 6 weeks postpartum following a low cervical transverse Cesarean section. I have fully reviewed the prenatal and intrapartum course. The delivery was at 36.6 gestational weeks. Outcome: repeat cesarean section, low transverse incision. Anesthesia: spinal. Postpartum course has been unremarkable. Baby's course has been unremarkable. Baby is feeding by bottle - Similac Alimentum. Bleeding no bleeding. Bowel function is normal. Bladder function is normal. Patient is sexually active. Contraception method is tubal ligation and OCPs. Postpartum depression screening: negative.  The following portions of the patient's history were reviewed and updated as appropriate: allergies, current medications, past family history, past medical history, past social history, past surgical history and problem list.  Review of Systems Pertinent items are noted in HPI.   Objective:    There were no vitals taken for this visit.  General:  alert, cooperative and no distress   Breasts:     Lungs:    Heart:     Abdomen: soft, non-tender; bowel sounds normal; no masses,  no organomegaly and incision intact   Vulva:  normal std probe done  Vagina: not evaluated  Cervix:     Corpus: not examined  Adnexa:  not evaluated  Rectal Exam: Not performed.        Assessment:     normal postpartum exam. Pap smear not done at today's visit.   Plan:    1. Contraception: OCP (estrogen/progesterone) 2. Wants to have BTL.  The procedure and the risk of anesthesia, bleeding, infection, bowel and bladder injury, failure (1/200) and ectopic pregnancy were discussed and her questions were answered. The procedure will be scheduled as an outpatient.    3. Follow up as needed.    4. STD testing will notify  Adam Phenix, MD 09/22/2014

## 2014-09-24 LAB — GC/CHLAMYDIA PROBE AMP
CT PROBE, AMP APTIMA: NEGATIVE
GC PROBE AMP APTIMA: NEGATIVE

## 2014-09-25 ENCOUNTER — Encounter (HOSPITAL_COMMUNITY): Payer: Self-pay | Admitting: *Deleted

## 2014-10-01 ENCOUNTER — Encounter: Payer: Self-pay | Admitting: *Deleted

## 2014-10-21 ENCOUNTER — Encounter: Payer: Self-pay | Admitting: *Deleted

## 2014-10-24 ENCOUNTER — Other Ambulatory Visit: Payer: Self-pay | Admitting: Obstetrics & Gynecology

## 2014-10-28 ENCOUNTER — Inpatient Hospital Stay (HOSPITAL_COMMUNITY): Admission: RE | Admit: 2014-10-28 | Payer: Medicaid Other | Source: Ambulatory Visit

## 2014-11-03 ENCOUNTER — Telehealth (HOSPITAL_COMMUNITY): Payer: Self-pay | Admitting: Obstetrics & Gynecology

## 2014-11-03 NOTE — Telephone Encounter (Signed)
Received call from [facility] in pre-op that patient wants to cancel that she thinks she is pregnant.  I called patient to verify desire to cancel.  She does want to cancel, but wanted to reschedule for next week.  I told her we are booking cases out into November that she could not be rescheduled to next week.  She asked about alternate birth control.  Checked EPIC and she still has refills on her oral BC at her pharmacy.  She will use these until surgery is rescheduled.  Cancelled case with Kendal at central scheduling.

## 2014-11-04 ENCOUNTER — Encounter (HOSPITAL_COMMUNITY): Admission: RE | Payer: Self-pay | Source: Ambulatory Visit

## 2014-11-04 ENCOUNTER — Ambulatory Visit (HOSPITAL_COMMUNITY)
Admission: RE | Admit: 2014-11-04 | Payer: Medicaid Other | Source: Ambulatory Visit | Admitting: Obstetrics & Gynecology

## 2014-11-04 ENCOUNTER — Encounter (HOSPITAL_COMMUNITY): Payer: Self-pay | Admitting: *Deleted

## 2014-11-04 SURGERY — LIGATION, FALLOPIAN TUBE, LAPAROSCOPIC
Anesthesia: Choice | Site: Abdomen | Laterality: Bilateral

## 2014-11-27 ENCOUNTER — Encounter (HOSPITAL_COMMUNITY): Payer: Self-pay

## 2014-12-08 ENCOUNTER — Ambulatory Visit (HOSPITAL_COMMUNITY)
Admission: RE | Admit: 2014-12-08 | Payer: Medicaid Other | Source: Ambulatory Visit | Admitting: Obstetrics & Gynecology

## 2014-12-08 ENCOUNTER — Encounter: Payer: Self-pay | Admitting: *Deleted

## 2014-12-08 ENCOUNTER — Encounter (HOSPITAL_COMMUNITY): Payer: Self-pay | Admitting: Anesthesiology

## 2014-12-08 ENCOUNTER — Encounter (HOSPITAL_COMMUNITY): Payer: Self-pay | Admitting: *Deleted

## 2014-12-08 SURGERY — LIGATION, FALLOPIAN TUBE, LAPAROSCOPIC
Anesthesia: Choice | Site: Abdomen | Laterality: Bilateral

## 2014-12-08 MED ORDER — PROPOFOL 10 MG/ML IV BOLUS
INTRAVENOUS | Status: AC
Start: 2014-12-08 — End: 2014-12-08
  Filled 2014-12-08: qty 20

## 2014-12-08 MED ORDER — MIDAZOLAM HCL 2 MG/2ML IJ SOLN
INTRAMUSCULAR | Status: AC
  Filled 2014-12-08: qty 4

## 2014-12-08 MED ORDER — DEXAMETHASONE SODIUM PHOSPHATE 4 MG/ML IJ SOLN
INTRAMUSCULAR | Status: AC
  Filled 2014-12-08: qty 1

## 2014-12-08 MED ORDER — ROCURONIUM BROMIDE 100 MG/10ML IV SOLN
INTRAVENOUS | Status: AC
  Filled 2014-12-08: qty 1

## 2014-12-08 MED ORDER — GLYCOPYRROLATE 0.2 MG/ML IJ SOLN
INTRAMUSCULAR | Status: AC
  Filled 2014-12-08: qty 2

## 2014-12-08 MED ORDER — FENTANYL CITRATE (PF) 250 MCG/5ML IJ SOLN
INTRAMUSCULAR | Status: AC
  Filled 2014-12-08: qty 25

## 2014-12-08 MED ORDER — NEOSTIGMINE METHYLSULFATE 10 MG/10ML IV SOLN
INTRAVENOUS | Status: AC
  Filled 2014-12-08: qty 1

## 2014-12-08 MED ORDER — ONDANSETRON HCL 4 MG/2ML IJ SOLN
INTRAMUSCULAR | Status: AC
Start: 2014-12-08 — End: 2014-12-08
  Filled 2014-12-08: qty 2

## 2014-12-08 MED ORDER — LIDOCAINE HCL (CARDIAC) 20 MG/ML IV SOLN
INTRAVENOUS | Status: AC
  Filled 2014-12-08: qty 5

## 2014-12-08 NOTE — Progress Notes (Signed)
Patient originally scheduled for LBTL 11/04/14, did not show for her pre-op on 10/30/14.  Patient did call on 11/03/14 to cancel procedure but wanted to be rescheduled for a later date.  Reposted for 12/08/14 and did not show up for procedure day of surgery.  Patient will not be rescheduled.

## 2014-12-08 NOTE — Anesthesia Preprocedure Evaluation (Deleted)
Anesthesia Evaluation  Patient identified by MRN, date of birth, ID band Patient awake    Reviewed: Allergy & Precautions, H&P , Patient's Chart, lab work & pertinent test results, reviewed documented beta blocker date and time   Airway Mallampati: II  TM Distance: >3 FB Neck ROM: full    Dental no notable dental hx.    Pulmonary Current Smoker,    Pulmonary exam normal breath sounds clear to auscultation       Cardiovascular  Rhythm:regular Rate:Normal     Neuro/Psych    GI/Hepatic   Endo/Other    Renal/GU      Musculoskeletal   Abdominal   Peds  Hematology   Anesthesia Other Findings   Reproductive/Obstetrics                             Anesthesia Physical Anesthesia Plan  ASA: II  Anesthesia Plan: General   Post-op Pain Management:    Induction: Intravenous  Airway Management Planned: Oral ETT  Additional Equipment:   Intra-op Plan:   Post-operative Plan: Extubation in OR  Informed Consent: I have reviewed the patients History and Physical, chart, labs and discussed the procedure including the risks, benefits and alternatives for the proposed anesthesia with the patient or authorized representative who has indicated his/her understanding and acceptance.   Dental Advisory Given and Dental advisory given  Plan Discussed with: CRNA and Surgeon  Anesthesia Plan Comments: (  Discussed general anesthesia, including possible nausea, instrumentation of airway, sore throat,pulmonary aspiration, etc. I asked if the were any outstanding questions, or  concerns before we proceeded. )        Anesthesia Quick Evaluation  

## 2015-07-16 ENCOUNTER — Ambulatory Visit (HOSPITAL_COMMUNITY)
Admission: EM | Admit: 2015-07-16 | Discharge: 2015-07-16 | Disposition: A | Discharge status: Home / Self Care | Payer: Medicaid Other | Attending: Emergency Medicine | Admitting: Emergency Medicine

## 2015-07-16 ENCOUNTER — Encounter (HOSPITAL_COMMUNITY): Payer: Self-pay | Admitting: *Deleted

## 2015-07-16 DIAGNOSIS — J40 Bronchitis, not specified as acute or chronic: Secondary | ICD-10-CM | POA: Diagnosis not present

## 2015-07-16 MED ORDER — PREDNISONE 50 MG PO TABS: ORAL_TABLET | ORAL | Status: DC

## 2015-07-16 MED ORDER — BENZONATATE 100 MG PO CAPS: 100.0000 mg | ORAL_CAPSULE | Freq: Three times a day (TID) | ORAL | Status: DC

## 2015-07-16 NOTE — Discharge Instructions (Signed)
You have bronchitis. Take prednisone as prescribed. Use tessalon as needed for cough. You should see improvement in the next 2 days. If you develop fevers, difficulty breathing, or are just not getting better, please come back or go to the emergency room.

## 2015-07-16 NOTE — ED Provider Notes (Addendum)
CSN: [id number]     Arrival date & time 07/16/15  1506 History   First MD Initiated Contact with Patient 07/16/15 1629     No chief complaint on file. CC: cough  (Consider location/radiation/quality/duration/timing/severity/associated sxs/prior Treatment) HPI  She is a 25 year old woman here for evaluation of cough. She states the last 4 days she has had nasal congestion, rhinorrhea, and cough. She states she has pain and tightness in her chest with coughing only. She also reports hearing some wheezing when she coughs. She denies any shortness of breath. No fevers. She had one episode of posttussive vomiting today. She has not tried any medication.  Past Medical History  Diagnosis Date  . Medical history non-contributory    Past Surgical History  Procedure Laterality Date  . Dilation and curettage of uterus    . Cesarean section    . Cesarean section N/A 08/11/2014    Procedure: CESAREAN SECTION;  Surgeon: Adam PhenixJames G Arnold, MD;  Location: [facility];  Service: Obstetrics;  Laterality: N/A;   Family History  Problem Relation Age of Onset  . Hypertension Father   . COPD Father   . Heart disease Brother    Social History  Substance Use Topics  . Smoking status: Current Every Day Smoker -- 0.25 packs/day for 8 years    Types: Cigarettes  . Smokeless tobacco: Never Used  . Alcohol Use: Yes     Comment: weekend   OB History    Gravida Para Term Preterm AB TAB SAB Ectopic Multiple Living   3 3 1 2      0 2     Review of Systems As in history of present illness Allergies  Review of patient's allergies indicates no known allergies.  Home Medications   Prior to Admission medications   Medication Sig Start Date End Date Taking? Authorizing Provider  benzonatate (TESSALON) 100 MG capsule Take 1 capsule (100 mg total) by mouth every 8 (eight) hours. 07/16/15   Charm RingsErin J Joey Hudock, MD  predniSONE (DELTASONE) 50 MG tablet Take 1 pill daily for 5 days. 07/16/15   Charm RingsErin J Chaney Ingram, MD   Meds Ordered and  Administered this Visit  Medications - No data to display  BP 128/78 mmHg  Pulse 70  Temp(Src) 98.9 F (37.2 C) (Oral)  Resp 16  SpO2 100% No data found.   Physical Exam  Constitutional: She is oriented to person, place, and time. She appears well-developed and well-nourished. No distress.  HENT:  Mouth/Throat: Oropharynx is clear and moist. No oropharyngeal exudate.  Nasal mucosa is erythematous and boggy.  Neck: Neck supple.  Cardiovascular: Normal rate, regular rhythm and normal heart sounds.   No murmur heard. Pulmonary/Chest: Effort normal and breath sounds normal. No respiratory distress. She has no wheezes. She has no rales.  She does have a slight wheeze with forced cough.  Lymphadenopathy:    She has no cervical adenopathy.  Neurological: She is alert and oriented to person, place, and time.    ED Course  Procedures (including critical care time)  Labs Review Labs Reviewed - No data to display  Imaging Review No results found.   MDM   1. Bronchitis    Treat with prednisone. Tessalon as needed for cough. Also recommended OTC allergy medicine and nasal saline spray. Return precautions reviewed.    Charm RingsErin J Poseidon Pam, MD 07/16/15 [phone]  Charm RingsErin J Jeremiyah Cullens, MD 07/16/15 [phone]

## 2015-07-16 NOTE — ED Notes (Signed)
Pt  Reports   Cough       And     Congested     Hurts  In  Ribs   When  She  Coughs

## 2015-11-28 ENCOUNTER — Emergency Department (HOSPITAL_COMMUNITY): Payer: Medicaid Other

## 2015-11-28 ENCOUNTER — Inpatient Hospital Stay (HOSPITAL_COMMUNITY)
Admission: EM | Admit: 2015-11-28 | Discharge: 2015-12-08 | DRG: 492 | Disposition: A | Discharge status: Home / Self Care | Payer: Medicaid Other | Attending: Orthopedic Surgery | Admitting: Orthopedic Surgery

## 2015-11-28 DIAGNOSIS — S92251A Displaced fracture of navicular [scaphoid] of right foot, initial encounter for closed fracture: Secondary | ICD-10-CM | POA: Diagnosis present

## 2015-11-28 DIAGNOSIS — S92403A Displaced unspecified fracture of unspecified great toe, initial encounter for closed fracture: Secondary | ICD-10-CM | POA: Diagnosis present

## 2015-11-28 DIAGNOSIS — F17210 Nicotine dependence, cigarettes, uncomplicated: Secondary | ICD-10-CM | POA: Diagnosis present

## 2015-11-28 DIAGNOSIS — Y906 Blood alcohol level of 120-199 mg/100 ml: Secondary | ICD-10-CM | POA: Diagnosis present

## 2015-11-28 DIAGNOSIS — S82841A Displaced bimalleolar fracture of right lower leg, initial encounter for closed fracture: Secondary | ICD-10-CM | POA: Diagnosis present

## 2015-11-28 DIAGNOSIS — S52371A Galeazzi's fracture of right radius, initial encounter for closed fracture: Principal | ICD-10-CM | POA: Diagnosis present

## 2015-11-28 DIAGNOSIS — S8264XA Nondisplaced fracture of lateral malleolus of right fibula, initial encounter for closed fracture: Secondary | ICD-10-CM | POA: Diagnosis present

## 2015-11-28 DIAGNOSIS — S32039A Unspecified fracture of third lumbar vertebra, initial encounter for closed fracture: Secondary | ICD-10-CM | POA: Diagnosis present

## 2015-11-28 DIAGNOSIS — S92341A Displaced fracture of fourth metatarsal bone, right foot, initial encounter for closed fracture: Secondary | ICD-10-CM | POA: Diagnosis present

## 2015-11-28 DIAGNOSIS — T1490XA Injury, unspecified, initial encounter: Secondary | ICD-10-CM

## 2015-11-28 DIAGNOSIS — Z6837 Body mass index (BMI) 37.0-37.9, adult: Secondary | ICD-10-CM

## 2015-11-28 DIAGNOSIS — S92101A Unspecified fracture of right talus, initial encounter for closed fracture: Secondary | ICD-10-CM | POA: Diagnosis present

## 2015-11-28 DIAGNOSIS — J9600 Acute respiratory failure, unspecified whether with hypoxia or hypercapnia: Secondary | ICD-10-CM | POA: Diagnosis present

## 2015-11-28 DIAGNOSIS — S32009A Unspecified fracture of unspecified lumbar vertebra, initial encounter for closed fracture: Secondary | ICD-10-CM | POA: Diagnosis present

## 2015-11-28 DIAGNOSIS — S32029A Unspecified fracture of second lumbar vertebra, initial encounter for closed fracture: Secondary | ICD-10-CM | POA: Diagnosis present

## 2015-11-28 DIAGNOSIS — R4182 Altered mental status, unspecified: Secondary | ICD-10-CM | POA: Diagnosis present

## 2015-11-28 DIAGNOSIS — R092 Respiratory arrest: Secondary | ICD-10-CM

## 2015-11-28 DIAGNOSIS — S32009S Unspecified fracture of unspecified lumbar vertebra, sequela: Secondary | ICD-10-CM

## 2015-11-28 DIAGNOSIS — R1084 Generalized abdominal pain: Secondary | ICD-10-CM | POA: Diagnosis present

## 2015-11-28 DIAGNOSIS — Z9889 Other specified postprocedural states: Secondary | ICD-10-CM

## 2015-11-28 DIAGNOSIS — F10129 Alcohol abuse with intoxication, unspecified: Secondary | ICD-10-CM | POA: Diagnosis present

## 2015-11-28 DIAGNOSIS — M25572 Pain in left ankle and joints of left foot: Secondary | ICD-10-CM

## 2015-11-28 DIAGNOSIS — S82141A Displaced bicondylar fracture of right tibia, initial encounter for closed fracture: Secondary | ICD-10-CM | POA: Diagnosis present

## 2015-11-28 DIAGNOSIS — S92211A Displaced fracture of cuboid bone of right foot, initial encounter for closed fracture: Secondary | ICD-10-CM | POA: Diagnosis present

## 2015-11-28 DIAGNOSIS — Z23 Encounter for immunization: Secondary | ICD-10-CM

## 2015-11-28 DIAGNOSIS — S92001A Unspecified fracture of right calcaneus, initial encounter for closed fracture: Secondary | ICD-10-CM | POA: Diagnosis present

## 2015-11-28 DIAGNOSIS — S81812A Laceration without foreign body, left lower leg, initial encounter: Secondary | ICD-10-CM | POA: Diagnosis present

## 2015-11-28 DIAGNOSIS — S62101A Fracture of unspecified carpal bone, right wrist, initial encounter for closed fracture: Secondary | ICD-10-CM

## 2015-11-28 DIAGNOSIS — S2241XA Multiple fractures of ribs, right side, initial encounter for closed fracture: Secondary | ICD-10-CM | POA: Diagnosis present

## 2015-11-28 DIAGNOSIS — Z9289 Personal history of other medical treatment: Secondary | ICD-10-CM

## 2015-11-28 DIAGNOSIS — M25561 Pain in right knee: Secondary | ICD-10-CM

## 2015-11-28 DIAGNOSIS — Y92410 Unspecified street and highway as the place of occurrence of the external cause: Secondary | ICD-10-CM

## 2015-11-28 DIAGNOSIS — R52 Pain, unspecified: Secondary | ICD-10-CM

## 2015-11-28 DIAGNOSIS — S82891A Other fracture of right lower leg, initial encounter for closed fracture: Secondary | ICD-10-CM

## 2015-11-28 DIAGNOSIS — S92901A Unspecified fracture of right foot, initial encounter for closed fracture: Secondary | ICD-10-CM | POA: Diagnosis present

## 2015-11-28 LAB — LACTIC ACID, PLASMA: LACTIC ACID, VENOUS: 1.3 mmol/L (ref 0.5–1.9)

## 2015-11-28 LAB — I-STAT CHEM 8, ED
BUN: 6 mg/dL (ref 6–20)
CALCIUM ION: 1.05 mmol/L — AB (ref 1.15–1.40)
CHLORIDE: 106 mmol/L (ref 101–111)
Creatinine, Ser: 1.3 mg/dL — ABNORMAL HIGH (ref 0.44–1.00)
Glucose, Bld: 141 mg/dL — ABNORMAL HIGH (ref 65–99)
HCT: 43 % (ref 36.0–46.0)
Hemoglobin: 14.6 g/dL (ref 12.0–15.0)
Potassium: 3.8 mmol/L (ref 3.5–5.1)
Sodium: 141 mmol/L (ref 135–145)
TCO2: 20 mmol/L (ref 0–100)

## 2015-11-28 LAB — CBC
HEMATOCRIT: 41.2 % (ref 36.0–46.0)
HEMATOCRIT: 41.7 % (ref 36.0–46.0)
HEMOGLOBIN: 13.6 g/dL (ref 12.0–15.0)
Hemoglobin: 13.6 g/dL (ref 12.0–15.0)
MCH: 29 pg (ref 26.0–34.0)
MCH: 28.9 pg (ref 26.0–34.0)
MCHC: 33 g/dL (ref 30.0–36.0)
MCHC: 32.6 g/dL (ref 30.0–36.0)
MCV: 87.8 fL (ref 78.0–100.0)
MCV: 88.7 fL (ref 78.0–100.0)
Platelets: 337 10*3/uL (ref 150–400)
Platelets: 316 10*3/uL (ref 150–400)
RBC: 4.69 MIL/uL (ref 3.87–5.11)
RBC: 4.7 MIL/uL (ref 3.87–5.11)
RDW: 14.2 % (ref 11.5–15.5)
RDW: 14.4 % (ref 11.5–15.5)
WBC: 15.3 10*3/uL — ABNORMAL HIGH (ref 4.0–10.5)
WBC: 19.1 10*3/uL — AB (ref 4.0–10.5)

## 2015-11-28 LAB — URINALYSIS, ROUTINE W REFLEX MICROSCOPIC
Bilirubin Urine: NEGATIVE
GLUCOSE, UA: NEGATIVE mg/dL
Ketones, ur: NEGATIVE mg/dL
Leukocytes, UA: NEGATIVE
Nitrite: NEGATIVE
PH: 6 (ref 5.0–8.0)
Protein, ur: NEGATIVE mg/dL
SPECIFIC GRAVITY, URINE: 1.022 (ref 1.005–1.030)

## 2015-11-28 LAB — TYPE AND SCREEN
ABO/RH(D): A POS
Antibody Screen: NEGATIVE
UNIT DIVISION: 0
Unit division: 0

## 2015-11-28 LAB — BASIC METABOLIC PANEL
Anion gap: 10 (ref 5–15)
BUN: 6 mg/dL (ref 6–20)
CO2: 21 mmol/L — AB (ref 22–32)
Calcium: 7.9 mg/dL — ABNORMAL LOW (ref 8.9–10.3)
Chloride: 108 mmol/L (ref 101–111)
Creatinine, Ser: 0.93 mg/dL (ref 0.44–1.00)
GFR calc Af Amer: >60 mL/min (ref >60)
GLUCOSE: 116 mg/dL — AB (ref 65–99)
POTASSIUM: 4 mmol/L (ref 3.5–5.1)
Sodium: 139 mmol/L (ref 135–145)

## 2015-11-28 LAB — COMPREHENSIVE METABOLIC PANEL
ALBUMIN: 3.9 g/dL (ref 3.5–5.0)
ALK PHOS: 73 U/L (ref 38–126)
ALT: 40 U/L (ref 14–54)
ANION GAP: 12 (ref 5–15)
AST: 100 U/L — AB (ref 15–41)
BUN: 6 mg/dL (ref 6–20)
CO2: 19 mmol/L — AB (ref 22–32)
Calcium: 8.8 mg/dL — ABNORMAL LOW (ref 8.9–10.3)
Chloride: 108 mmol/L (ref 101–111)
Creatinine, Ser: 1.05 mg/dL — ABNORMAL HIGH (ref 0.44–1.00)
GLUCOSE: 148 mg/dL — AB (ref 65–99)
POTASSIUM: 3.9 mmol/L (ref 3.5–5.1)
SODIUM: 139 mmol/L (ref 135–145)
Total Bilirubin: 0.4 mg/dL (ref 0.3–1.2)
Total Protein: 7 g/dL (ref 6.5–8.1)

## 2015-11-28 LAB — PREPARE FRESH FROZEN PLASMA
UNIT DIVISION: 0
Unit division: 0

## 2015-11-28 LAB — URINE MICROSCOPIC-ADD ON: WBC UA: NONE SEEN WBC/hpf (ref 0–5)

## 2015-11-28 LAB — I-STAT CG4 LACTIC ACID, ED: Lactic Acid, Venous: 3.62 mmol/L (ref 0.5–1.9)

## 2015-11-28 LAB — TRIGLYCERIDES: TRIGLYCERIDES: 140 mg/dL (ref <150)

## 2015-11-28 LAB — I-STAT ARTERIAL BLOOD GAS, ED
Acid-base deficit: 9 mmol/L — ABNORMAL HIGH (ref 0.0–2.0)
Bicarbonate: 17.5 mmol/L — ABNORMAL LOW (ref 20.0–28.0)
O2 SAT: 100 %
PCO2 ART: 39.6 mmHg (ref 32.0–48.0)
PH ART: 7.253 — AB (ref 7.350–7.450)
PO2 ART: 315 mmHg — AB (ref 83.0–108.0)
Patient temperature: 98.6
TCO2: 19 mmol/L (ref 0–100)

## 2015-11-28 LAB — ETHANOL: Alcohol, Ethyl (B): 191 mg/dL — ABNORMAL HIGH (ref <5)

## 2015-11-28 LAB — I-STAT BETA HCG BLOOD, ED (MC, WL, AP ONLY)

## 2015-11-28 LAB — CBG MONITORING, ED: Glucose-Capillary: 155 mg/dL — ABNORMAL HIGH (ref 65–99)

## 2015-11-28 LAB — RAPID URINE DRUG SCREEN, HOSP PERFORMED
AMPHETAMINES: NOT DETECTED
Barbiturates: NOT DETECTED
Benzodiazepines: NOT DETECTED
Cocaine: NOT DETECTED
OPIATES: NOT DETECTED
Tetrahydrocannabinol: POSITIVE — AB

## 2015-11-28 LAB — PREGNANCY, URINE: PREG TEST UR: NEGATIVE

## 2015-11-28 LAB — MRSA PCR SCREENING: MRSA by PCR: NEGATIVE

## 2015-11-28 LAB — PROTIME-INR
INR: 1.04
Prothrombin Time: 13.6 seconds (ref 11.4–15.2)
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
## 2015-11-28 MED ORDER — ONDANSETRON HCL 4 MG/2ML IJ SOLN
4.0000 mg | Freq: Four times a day (QID) | INTRAMUSCULAR | Status: DC | PRN
Start: 2015-11-28 — End: 2015-11-30

## 2015-11-28 MED ORDER — PANTOPRAZOLE SODIUM 40 MG PO TBEC
40.0000 mg | DELAYED_RELEASE_TABLET | Freq: Two times a day (BID) | ORAL | Status: DC
  Administered 2015-11-29: 40 mg via ORAL
  Filled 2015-11-28: qty 1

## 2015-11-28 MED ORDER — FENTANYL BOLUS VIA INFUSION
50.0000 ug | INTRAVENOUS | Status: DC | PRN
  Filled 2015-11-28: qty 50

## 2015-11-28 MED ORDER — CEFAZOLIN SODIUM-DEXTROSE 2-4 GM/100ML-% IV SOLN
INTRAVENOUS | Status: AC
  Administered 2015-11-28: 2000 mg
  Filled 2015-11-28: qty 100

## 2015-11-28 MED ORDER — CEFAZOLIN IN D5W 1 GM/50ML IV SOLN
1.0000 g | Freq: Once | INTRAVENOUS | Status: AC
Start: 2015-11-28 — End: 2015-11-28
  Administered 2015-11-28: 1 g via INTRAVENOUS

## 2015-11-28 MED ORDER — TETANUS-DIPHTH-ACELL PERTUSSIS 5-2.5-18.5 LF-MCG/0.5 IM SUSP
INTRAMUSCULAR | Status: AC
  Filled 2015-11-28: qty 0.5

## 2015-11-28 MED ORDER — HYDROMORPHONE 1 MG/ML IV SOLN
INTRAVENOUS | Status: DC
  Administered 2015-11-28: 13:00:00 via INTRAVENOUS
  Administered 2015-11-28: 2.7 mg via INTRAVENOUS
  Administered 2015-11-28 – 2015-11-29 (×2): 2.1 mg via INTRAVENOUS
  Administered 2015-11-29 (×2): 1.8 mg via INTRAVENOUS
  Administered 2015-11-29: 2.1 mg via INTRAVENOUS
  Administered 2015-11-29: 0.6 mg via INTRAVENOUS
  Administered 2015-11-29: 2.7 mg via INTRAVENOUS
  Administered 2015-11-30: 0.6 mg via INTRAVENOUS
  Administered 2015-11-30: 1.2 mg via INTRAVENOUS
  Administered 2015-11-30: 0.3 mg via INTRAVENOUS
  Filled 2015-11-28: qty 25

## 2015-11-28 MED ORDER — DEXTROSE-NACL 5-0.9 % IV SOLN
INTRAVENOUS | Status: DC
  Administered 2015-11-28: 07:00:00 via INTRAVENOUS

## 2015-11-28 MED ORDER — ORAL CARE MOUTH RINSE
15.0000 mL | Freq: Four times a day (QID) | OROMUCOSAL | Status: DC
Start: 2015-11-28 — End: 2015-11-29

## 2015-11-28 MED ORDER — ENOXAPARIN SODIUM 40 MG/0.4ML ~~LOC~~ SOLN
40.0000 mg | SUBCUTANEOUS | Status: DC
  Administered 2015-11-28 – 2015-11-29 (×2): 40 mg via SUBCUTANEOUS
  Filled 2015-11-28 (×2): qty 0.4

## 2015-11-28 MED ORDER — TETANUS-DIPHTH-ACELL PERTUSSIS 5-2.5-18.5 LF-MCG/0.5 IM SUSP
0.5000 mL | Freq: Once | INTRAMUSCULAR | Status: AC
  Administered 2015-11-28: 0.5 mL via INTRAMUSCULAR

## 2015-11-28 MED ORDER — SODIUM CHLORIDE 0.9 % IV BOLUS (SEPSIS)
2000.0000 mL | Freq: Once | INTRAVENOUS | Status: AC
  Administered 2015-11-28: 2000 mL via INTRAVENOUS

## 2015-11-28 MED ORDER — FENTANYL CITRATE (PF) 100 MCG/2ML IJ SOLN
INTRAMUSCULAR | Status: AC
  Filled 2015-11-28: qty 2

## 2015-11-28 MED ORDER — ONDANSETRON HCL 4 MG/2ML IJ SOLN
4.0000 mg | Freq: Four times a day (QID) | INTRAMUSCULAR | Status: DC | PRN
  Administered 2015-11-28 – 2015-11-29 (×2): 4 mg via INTRAVENOUS
  Filled 2015-11-28 (×2): qty 2

## 2015-11-28 MED ORDER — VECURONIUM BROMIDE 10 MG IV SOLR
INTRAVENOUS | Status: AC
  Filled 2015-11-28: qty 10

## 2015-11-28 MED ORDER — CEFAZOLIN SODIUM-DEXTROSE 2-4 GM/100ML-% IV SOLN
2.0000 g | INTRAVENOUS | Status: AC
  Administered 2015-11-29: 2 g via INTRAVENOUS
  Filled 2015-11-28: qty 100

## 2015-11-28 MED ORDER — VECURONIUM BROMIDE 10 MG IV SOLR
10.0000 mg | Freq: Once | INTRAVENOUS | Status: AC
  Administered 2015-11-28: 10 mg via INTRAVENOUS
  Filled 2015-11-28: qty 10

## 2015-11-28 MED ORDER — DEXTROSE IN LACTATED RINGERS 5 % IV SOLN: INTRAVENOUS | Status: DC

## 2015-11-28 MED ORDER — PROPOFOL 1000 MG/100ML IV EMUL
INTRAVENOUS | Status: AC
  Filled 2015-11-28: qty 100

## 2015-11-28 MED ORDER — DIPHENHYDRAMINE HCL 50 MG/ML IJ SOLN
12.5000 mg | Freq: Four times a day (QID) | INTRAMUSCULAR | Status: DC | PRN
Start: 2015-11-28 — End: 2015-11-30

## 2015-11-28 MED ORDER — IOPAMIDOL (ISOVUE-300) INJECTION 61%
100.0000 mL | Freq: Once | INTRAVENOUS | Status: AC | PRN
  Administered 2015-11-28: 100 mL via INTRAVENOUS

## 2015-11-28 MED ORDER — ROCURONIUM BROMIDE 50 MG/5ML IV SOLN
INTRAVENOUS | Status: AC | PRN
  Administered 2015-11-28: 80 mg via INTRAVENOUS
  Administered 2015-11-28: 20 mg via INTRAVENOUS

## 2015-11-28 MED ORDER — FENTANYL CITRATE (PF) 100 MCG/2ML IJ SOLN
50.0000 ug | Freq: Once | INTRAMUSCULAR | Status: AC
  Administered 2015-11-28: 50 ug via INTRAVENOUS

## 2015-11-28 MED ORDER — FENTANYL 2500MCG IN NS 250ML (10MCG/ML) PREMIX INFUSION
25.0000 ug/h | INTRAVENOUS | Status: DC
  Administered 2015-11-28: 50 ug/h via INTRAVENOUS
  Filled 2015-11-28: qty 250

## 2015-11-28 MED ORDER — SODIUM CHLORIDE 0.9% FLUSH: 9.0000 mL | INTRAVENOUS | Status: DC | PRN

## 2015-11-28 MED ORDER — NALOXONE HCL 0.4 MG/ML IJ SOLN: 0.4000 mg | INTRAMUSCULAR | Status: DC | PRN

## 2015-11-28 MED ORDER — ETOMIDATE 2 MG/ML IV SOLN
INTRAVENOUS | Status: AC | PRN
  Administered 2015-11-28: 20 mg via INTRAVENOUS

## 2015-11-28 MED ORDER — PROPOFOL 1000 MG/100ML IV EMUL
5.0000 ug/kg/min | INTRAVENOUS | Status: DC
  Administered 2015-11-28: 50 ug/kg/min via INTRAVENOUS
  Administered 2015-11-28: 60 ug/kg/min via INTRAVENOUS
  Filled 2015-11-28: qty 100

## 2015-11-28 MED ORDER — CHLORHEXIDINE GLUCONATE 4 % EX LIQD
60.0000 mL | Freq: Once | CUTANEOUS | Status: AC
  Administered 2015-11-29: 4 via TOPICAL
  Filled 2015-11-28: qty 60

## 2015-11-28 MED ORDER — CHLORHEXIDINE GLUCONATE 0.12% ORAL RINSE (MEDLINE KIT)
15.0000 mL | Freq: Two times a day (BID) | OROMUCOSAL | Status: DC
  Administered 2015-11-28 – 2015-11-29 (×4): 15 mL via OROMUCOSAL

## 2015-11-28 MED ORDER — DIPHENHYDRAMINE HCL 12.5 MG/5ML PO ELIX: 12.5000 mg | ORAL_SOLUTION | Freq: Four times a day (QID) | ORAL | Status: DC | PRN

## 2015-11-28 MED ORDER — FAMOTIDINE IN NACL 20-0.9 MG/50ML-% IV SOLN
20.0000 mg | Freq: Two times a day (BID) | INTRAVENOUS | Status: DC
  Administered 2015-11-28 (×2): 20 mg via INTRAVENOUS
  Filled 2015-11-28 (×5): qty 50

## 2015-11-28 MED ORDER — SODIUM CHLORIDE 0.9 % IV SOLN
INTRAVENOUS | Status: DC
  Administered 2015-11-28: 10:00:00 via INTRAVENOUS

## 2015-11-28 MED ORDER — ONDANSETRON HCL 4 MG PO TABS: 4.0000 mg | ORAL_TABLET | Freq: Four times a day (QID) | ORAL | Status: DC | PRN

## 2015-11-28 MED ORDER — PROPOFOL 1000 MG/100ML IV EMUL
INTRAVENOUS | Status: AC
  Administered 2015-11-28: 60 ug/kg/min
  Filled 2015-11-28: qty 100
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
## 2015-11-28 NOTE — Progress Notes (Signed)
Orthopedic Tech Progress Note Patient Details:  Rex [place] Rrr Doe 02/14/1877 [id number]  Ortho Devices Type of Ortho Device: Volar splint Ortho Device/Splint Location: rue Ortho Device/Splint Interventions: Ordered, Application Applied splint as per drs verbal order  Trinna PostMartinez, Dalylah Ramey J 11/28/2015, 2:30 AM

## 2015-11-28 NOTE — Progress Notes (Signed)
Orthopedic Tech Progress Note Patient Details:  Tami Everett 02/14/1877 [id number]  Ortho Devices Type of Ortho Device: Post (long leg) splint Ortho Device/Splint Location: rle Ortho Device/Splint Interventions: Ordered, Application   Tami Everett, Tami Everett 11/28/2015, 3:50 AM

## 2015-11-28 NOTE — Progress Notes (Signed)
Following commands, opening eyes, will wean vent with possible extubation per protocol due to rib fractures

## 2015-11-28 NOTE — ED Provider Notes (Addendum)
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 Tube secured with: ETT holder Chest x-ray interpreted by radiologist and me.  Chest x-ray findings: endotracheal tube in appropriate position  Patient tolerated the procedure well with no immediate complications.  Medications Ordered in ED Medications  etomidate  (AMIDATE) injection (20 mg Intravenous Given 11/28/15 0153)  rocuronium (ZEMURON) injection (20 mg Intravenous Given 11/28/15 0154)  Tdap (BOOSTRIX) 5-2.5-18.5 LF-MCG/0.5 injection (not administered)  vecuronium (NORCURON) 10 MG injection (not administered)  enoxaparin (LOVENOX) injection 40 mg (not administered)  dextrose 5 % in lactated ringers infusion (not administered)  ondansetron (ZOFRAN) tablet 4 mg (not administered)    Or  ondansetron (ZOFRAN) injection 4 mg (not administered)  pantoprazole (PROTONIX) EC tablet 40 mg (not administered)    Or  famotidine (PEPCID) IVPB 20 mg premix (not administered)  chlorhexidine gluconate (MEDLINE KIT) (PERIDEX) 0.12 % solution  15 mL (not administered)  MEDLINE mouth rinse (not administered)  propofol (DIPRIVAN) 1000 MG/100ML infusion (not administered)  Tdap (BOOSTRIX) injection 0.5 mL (0.5 mLs Intramuscular Given 11/28/15 0239)  ceFAZolin (ANCEF) IVPB 1 g/50 mL premix (0 g Intravenous Stopped 11/28/15 0333)  sodium chloride 0.9 % bolus 2,000 mL (0 mLs Intravenous Stopped 11/28/15 0333)  propofol (DIPRIVAN) 1000 MG/100ML infusion (60 mcg/kg/min  New Bag/Given 11/28/15 0328)  ceFAZolin (ANCEF) 2-4 GM/100ML-% IVPB (2,000 mg  New Bag/Given 11/28/15 0243)  iopamidol (ISOVUE-300) 61 % injection 100 mL (100 mLs Intravenous Contrast Given 11/28/15 0307)  vecuronium (NORCURON) injection 10 mg (10 mg Intravenous Given 11/28/15 0328)   Initial Impression / Assessment and Plan / ED Course  I have reviewed the triage vital signs and the nursing notes.  Pertinent labs & imaging results that were available during my care of the patient were reviewed by me and considered in my medical decision making (see chart for details).  Clinical Course  MDM Reviewed: nursing note and vitals Interpretation: labs, ECG and x-ray (CXR nacpd by me elevated lactate by me not pregnant.  Fracture dislocation of the right wrist) Total time providing critical care: > 105  minutes. This excludes time spent performing separately reportable procedures and services. Consults: trauma and orthopedics (Dr. Alvan Dame will see the patient)   CRITICAL CARE Performed by: Carlisle Beers Total critical care time: 120 minutes Critical care time was exclusive of separately billable procedures and treating other patients. Critical care was necessary to treat or prevent imminent or life-threatening deterioration. Critical care was time spent personally by me on the following activities: development of treatment plan with patient and/or surrogate as well as nursing, discussions with consultants, evaluation of patient's response to treatment, examination of patient, obtaining history from patient or surrogate, ordering and performing treatments and interventions, ordering and review of laboratory studies, ordering and review of radiographic studies, pulse oximetry and re-evaluation of patient's condition.  Placed call to hand surgery awaiting call back Final Clinical Impressions(s) / ED Diagnoses   Final diagnoses:  Required emergent intubation   New Prescriptions New Prescriptions   No medications on file   I personally performed the services described in this documentation, which was scribed in my presence. The recorded information has been reviewed and is accurate.       Veatrice Kells, MD 11/28/15 0443    Thirza Pellicano, MD 11/28/15 [id number]    Blane Worthington, MD 11/28/15 [phone]
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
## 2015-11-28 NOTE — Progress Notes (Addendum)
Orthopedic Tech Progress Note Patient Details:  [place] ServiceViola S Colarusso October 21, 1990 [id number]  Ortho Devices Type of Ortho Device: Ace wrap, Stirrup splint Ortho Device/Splint Location: RLE Ortho Device/Splint Interventions: Ordered, Application Long leg stirrup splint.  Jennye MoccasinHughes, Tamberlyn Midgley Craig 11/28/2015, 10:27 PM

## 2015-11-28 NOTE — Progress Notes (Signed)
Responded to page. Patient not identified. No family present. Chaplain available for follow-up.   11/28/15 0300  Clinical Encounter Type  Visited With Patient not available  Visit Type Initial  Referral From Nurse  Spiritual Encounters  Spiritual Needs Other (Comment)  Stress Factors  Patient Stress Factors Health changes  Family Stress Factors None identified
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
## 2015-11-28 NOTE — Procedures (Signed)
Extubation Procedure Note  Patient Details:   Name: Tami Everett DOB: Jun 26, 1990 MRN: [id number]   Airway Documentation:     Evaluation  O2 sats: stable throughout Complications: No apparent complications Patient did tolerate procedure well. Bilateral Breath Sounds: Clear, Diminished   Yes  Dairl PonderWalters, Jaison Petraglia Nannette 11/28/2015, 11:16 AM  Positive cuff leak pre extubation. PT placed on 2 lpm [place].

## 2015-11-29 ENCOUNTER — Encounter (HOSPITAL_COMMUNITY): Payer: Self-pay | Admitting: Anesthesiology

## 2015-11-29 ENCOUNTER — Inpatient Hospital Stay (HOSPITAL_COMMUNITY): Payer: Medicaid Other | Admitting: Anesthesiology

## 2015-11-29 ENCOUNTER — Encounter (HOSPITAL_COMMUNITY): Admission: EM | Disposition: A | Discharge status: Home / Self Care | Payer: Self-pay | Source: Home / Self Care

## 2015-11-29 ENCOUNTER — Inpatient Hospital Stay (HOSPITAL_COMMUNITY): Payer: Medicaid Other

## 2015-11-29 HISTORY — PX: OPEN REDUCTION INTERNAL FIXATION (ORIF) DISTAL RADIAL FRACTURE: SHX5989

## 2015-11-29 SURGERY — OPEN REDUCTION INTERNAL FIXATION (ORIF) DISTAL RADIUS FRACTURE
Anesthesia: General | Site: Wrist | Laterality: Right

## 2015-11-29 MED ORDER — FENTANYL CITRATE (PF) 100 MCG/2ML IJ SOLN
INTRAMUSCULAR | Status: AC
  Filled 2015-11-29: qty 4

## 2015-11-29 MED ORDER — HYDROMORPHONE HCL 1 MG/ML IJ SOLN
0.2500 mg | INTRAMUSCULAR | Status: DC | PRN
  Administered 2015-11-29 (×2): 0.5 mg via INTRAVENOUS

## 2015-11-29 MED ORDER — CEFAZOLIN SODIUM-DEXTROSE 2-4 GM/100ML-% IV SOLN
INTRAVENOUS | Status: AC
  Filled 2015-11-29: qty 100

## 2015-11-29 MED ORDER — MIDAZOLAM HCL 2 MG/2ML IJ SOLN
INTRAMUSCULAR | Status: AC
  Filled 2015-11-29: qty 2

## 2015-11-29 MED ORDER — ARTIFICIAL TEARS OP OINT
TOPICAL_OINTMENT | OPHTHALMIC | Status: DC | PRN
  Administered 2015-11-29: 1 via OPHTHALMIC

## 2015-11-29 MED ORDER — MEPERIDINE HCL 25 MG/ML IJ SOLN: 6.2500 mg | INTRAMUSCULAR | Status: DC | PRN

## 2015-11-29 MED ORDER — PROPOFOL 10 MG/ML IV BOLUS
INTRAVENOUS | Status: DC | PRN
  Administered 2015-11-29: 100 mg via INTRAVENOUS
  Administered 2015-11-29: 30 mg via INTRAVENOUS
  Administered 2015-11-29: 170 mg via INTRAVENOUS

## 2015-11-29 MED ORDER — PROPOFOL 10 MG/ML IV BOLUS
INTRAVENOUS | Status: AC
  Filled 2015-11-29: qty 20

## 2015-11-29 MED ORDER — ONDANSETRON HCL 4 MG/2ML IJ SOLN: 4.0000 mg | Freq: Once | INTRAMUSCULAR | Status: DC | PRN

## 2015-11-29 MED ORDER — ONDANSETRON HCL 4 MG/2ML IJ SOLN
INTRAMUSCULAR | Status: DC | PRN
  Administered 2015-11-29: 4 mg via INTRAVENOUS

## 2015-11-29 MED ORDER — 0.9 % SODIUM CHLORIDE (POUR BTL) OPTIME
TOPICAL | Status: DC | PRN
  Administered 2015-11-29: 1000 mL

## 2015-11-29 MED ORDER — OXYCODONE HCL 5 MG/5ML PO SOLN: 5.0000 mg | Freq: Once | ORAL | Status: DC | PRN

## 2015-11-29 MED ORDER — BUPIVACAINE HCL (PF) 0.25 % IJ SOLN
INTRAMUSCULAR | Status: DC | PRN
  Administered 2015-11-29: 20 mL

## 2015-11-29 MED ORDER — OXYCODONE HCL 5 MG PO TABS: 5.0000 mg | ORAL_TABLET | Freq: Once | ORAL | Status: DC | PRN

## 2015-11-29 MED ORDER — DEXAMETHASONE SODIUM PHOSPHATE 10 MG/ML IJ SOLN
INTRAMUSCULAR | Status: DC | PRN
  Administered 2015-11-29: 10 mg via INTRAVENOUS

## 2015-11-29 MED ORDER — WHITE PETROLATUM GEL
Status: AC
  Administered 2015-11-29: 1
  Filled 2015-11-29: qty 1

## 2015-11-29 MED ORDER — CEFAZOLIN IN D5W 1 GM/50ML IV SOLN
1.0000 g | Freq: Three times a day (TID) | INTRAVENOUS | Status: DC
  Administered 2015-11-29 – 2015-12-01 (×6): 1 g via INTRAVENOUS
  Filled 2015-11-29 (×8): qty 50

## 2015-11-29 MED ORDER — FENTANYL CITRATE (PF) 100 MCG/2ML IJ SOLN
INTRAMUSCULAR | Status: DC | PRN
  Administered 2015-11-29: 100 ug via INTRAVENOUS
  Administered 2015-11-29 (×2): 50 ug via INTRAVENOUS

## 2015-11-29 MED ORDER — MIDAZOLAM HCL 5 MG/5ML IJ SOLN
INTRAMUSCULAR | Status: DC | PRN
  Administered 2015-11-29: 2 mg via INTRAVENOUS

## 2015-11-29 MED ORDER — FENTANYL CITRATE (PF) 100 MCG/2ML IJ SOLN
25.0000 ug | INTRAMUSCULAR | Status: DC | PRN
  Administered 2015-11-29: 50 ug via INTRAVENOUS
  Filled 2015-11-29: qty 2

## 2015-11-29 MED ORDER — BUPIVACAINE HCL (PF) 0.25 % IJ SOLN
INTRAMUSCULAR | Status: AC
  Filled 2015-11-29: qty 30

## 2015-11-29 MED ORDER — ALBUTEROL SULFATE HFA 108 (90 BASE) MCG/ACT IN AERS
INHALATION_SPRAY | RESPIRATORY_TRACT | Status: DC | PRN
  Administered 2015-11-29: 3 via RESPIRATORY_TRACT

## 2015-11-29 MED ORDER — DEXAMETHASONE SODIUM PHOSPHATE 10 MG/ML IJ SOLN
INTRAMUSCULAR | Status: AC
  Filled 2015-11-29: qty 1

## 2015-11-29 MED ORDER — LIDOCAINE HCL (CARDIAC) 20 MG/ML IV SOLN
INTRAVENOUS | Status: DC | PRN
  Administered 2015-11-29: 80 mg via INTRAVENOUS

## 2015-11-29 MED ORDER — SUCCINYLCHOLINE CHLORIDE 200 MG/10ML IV SOSY
PREFILLED_SYRINGE | INTRAVENOUS | Status: AC
  Filled 2015-11-29: qty 10

## 2015-11-29 MED ORDER — HYDROMORPHONE HCL 1 MG/ML IJ SOLN
INTRAMUSCULAR | Status: AC
  Administered 2015-11-29: 1.8 mg
  Filled 2015-11-29: qty 1

## 2015-11-29 MED ORDER — LIDOCAINE 2% (20 MG/ML) 5 ML SYRINGE
INTRAMUSCULAR | Status: AC
  Filled 2015-11-29: qty 5

## 2015-11-29 MED ORDER — ROCURONIUM BROMIDE 10 MG/ML (PF) SYRINGE
PREFILLED_SYRINGE | INTRAVENOUS | Status: AC
  Filled 2015-11-29: qty 10

## 2015-11-29 MED ORDER — ALBUTEROL SULFATE HFA 108 (90 BASE) MCG/ACT IN AERS
INHALATION_SPRAY | RESPIRATORY_TRACT | Status: AC
  Filled 2015-11-29: qty 6.7

## 2015-11-29 MED ORDER — PROPOFOL 10 MG/ML IV BOLUS
INTRAVENOUS | Status: AC
  Filled 2015-11-29: qty 40

## 2015-11-29 MED ORDER — ARTIFICIAL TEARS OP OINT
TOPICAL_OINTMENT | OPHTHALMIC | Status: AC
  Filled 2015-11-29: qty 3.5

## 2015-11-29 MED ORDER — CEFAZOLIN IN D5W 1 GM/50ML IV SOLN
1.0000 g | INTRAVENOUS | Status: AC
  Administered 2015-11-29: 1 g via INTRAVENOUS
  Filled 2015-11-29: qty 50

## 2015-11-29 MED ORDER — SUCCINYLCHOLINE CHLORIDE 20 MG/ML IJ SOLN
INTRAMUSCULAR | Status: DC | PRN
  Administered 2015-11-29: 60 mg via INTRAVENOUS
  Administered 2015-11-29: 140 mg via INTRAVENOUS

## 2015-11-29 MED ORDER — LACTATED RINGERS IV SOLN
INTRAVENOUS | Status: DC | PRN
  Administered 2015-11-29: 09:00:00 via INTRAVENOUS

## 2015-11-29 SURGICAL SUPPLY — 65 items
BANDAGE ACE 4X5 VEL STRL LF (GAUZE/BANDAGES/DRESSINGS) ×3 IMPLANT
BANDAGE ELASTIC 3 VELCRO ST LF (GAUZE/BANDAGES/DRESSINGS) ×6 IMPLANT
BIT DRILL 2.2 SS TIBIAL (BIT) ×3 IMPLANT
BLADE SURG ROTATE 9660 (MISCELLANEOUS) IMPLANT
BNDG ESMARK 4X9 LF (GAUZE/BANDAGES/DRESSINGS) ×3 IMPLANT
BNDG GAUZE ELAST 4 BULKY (GAUZE/BANDAGES/DRESSINGS) ×3 IMPLANT
CANISTER SUCTION 2500CC (MISCELLANEOUS) ×3 IMPLANT
CORDS BIPOLAR (ELECTRODE) ×3 IMPLANT
COVER SURGICAL LIGHT HANDLE (MISCELLANEOUS) ×3 IMPLANT
CUFF TOURNIQUET SINGLE 18IN (TOURNIQUET CUFF) ×3 IMPLANT
CUFF TOURNIQUET SINGLE 24IN (TOURNIQUET CUFF) IMPLANT
DECANTER SPIKE VIAL GLASS SM (MISCELLANEOUS) ×3 IMPLANT
DRAPE OEC MINIVIEW 54X84 (DRAPES) ×3 IMPLANT
DRAPE SURG 17X11 SM STRL (DRAPES) ×3 IMPLANT
DRSG ADAPTIC 3X8 NADH LF (GAUZE/BANDAGES/DRESSINGS) ×3 IMPLANT
GAUZE SPONGE 4X4 12PLY STRL (GAUZE/BANDAGES/DRESSINGS) ×3 IMPLANT
GAUZE SPONGE 4X4 16PLY XRAY LF (GAUZE/BANDAGES/DRESSINGS) ×3 IMPLANT
GLOVE BIOGEL PI IND STRL 8.5 (GLOVE) ×1 IMPLANT
GLOVE BIOGEL PI INDICATOR 8.5 (GLOVE) ×2
GLOVE SURG ORTHO 8.0 STRL STRW (GLOVE) ×3 IMPLANT
GOWN STRL REUS W/ TWL LRG LVL3 (GOWN DISPOSABLE) ×1 IMPLANT
GOWN STRL REUS W/ TWL XL LVL3 (GOWN DISPOSABLE) ×1 IMPLANT
GOWN STRL REUS W/TWL LRG LVL3 (GOWN DISPOSABLE) ×2
GOWN STRL REUS W/TWL XL LVL3 (GOWN DISPOSABLE) ×2
K-WIRE 1.6 (WIRE) ×2
K-WIRE FX5X1.6XNS BN SS (WIRE) ×1
KIT BASIN OR (CUSTOM PROCEDURE TRAY) ×3 IMPLANT
KIT ROOM TURNOVER OR (KITS) ×3 IMPLANT
KWIRE FX5X1.6XNS BN SS (WIRE) ×1 IMPLANT
NEEDLE HYPO 25X1 1.5 SAFETY (NEEDLE) ×3 IMPLANT
NS IRRIG 1000ML POUR BTL (IV SOLUTION) ×3 IMPLANT
PACK ORTHO EXTREMITY (CUSTOM PROCEDURE TRAY) ×3 IMPLANT
PAD ARMBOARD 7.5X6 YLW CONV (MISCELLANEOUS) ×6 IMPLANT
PAD CAST 4YDX4 CTTN HI CHSV (CAST SUPPLIES) ×1 IMPLANT
PADDING CAST ABS 3INX4YD NS (CAST SUPPLIES) ×4
PADDING CAST ABS COTTON 3X4 (CAST SUPPLIES) ×2 IMPLANT
PADDING CAST COTTON 4X4 STRL (CAST SUPPLIES) ×2
PEG LOCKING SMOOTH 2.2X14 (Peg) ×3 IMPLANT
PEG LOCKING SMOOTH 2.2X18 (Peg) ×6 IMPLANT
PEG LOCKING SMOOTH 2.2X20 (Screw) ×3 IMPLANT
PLATE MEDIUM DVR RIGHT (Plate) ×3 IMPLANT
SCREW LOCK 14X2.7X 3 LD TPR (Screw) ×1 IMPLANT
SCREW LOCK 16X2.7X 3 LD TPR (Screw) ×2 IMPLANT
SCREW LOCK 20X2.7X 3 LD TPR (Screw) ×2 IMPLANT
SCREW LOCK 22X2.7X 3 LD TPR (Screw) ×1 IMPLANT
SCREW LOCKING 2.7X14 (Screw) ×2 IMPLANT
SCREW LOCKING 2.7X15MM (Screw) ×9 IMPLANT
SCREW LOCKING 2.7X16 (Screw) ×4 IMPLANT
SCREW LOCKING 2.7X20MM (Screw) ×4 IMPLANT
SCREW LOCKING 2.7X22MM (Screw) ×2 IMPLANT
SOAP 2 % CHG 4 OZ (WOUND CARE) ×3 IMPLANT
SPLINT FIBERGLASS 3X35 (CAST SUPPLIES) ×3 IMPLANT
SPONGE LAP 4X18 X RAY DECT (DISPOSABLE) ×3 IMPLANT
SUT VIC AB 2-0 CT1 27 (SUTURE) ×2
SUT VIC AB 2-0 CT1 TAPERPNT 27 (SUTURE) ×1 IMPLANT
SUT VIC AB 2-0 FS1 27 (SUTURE) IMPLANT
SUT VICRYL 4-0 PS2 18IN ABS (SUTURE) ×3 IMPLANT
SUT VICRYL RAPIDE 4/0 PS 2 (SUTURE) ×12 IMPLANT
SYR CONTROL 10ML LL (SYRINGE) IMPLANT
TOWEL OR 17X24 6PK STRL BLUE (TOWEL DISPOSABLE) ×3 IMPLANT
TOWEL OR 17X26 10 PK STRL BLUE (TOWEL DISPOSABLE) ×3 IMPLANT
TUBE CONNECTING 12'X1/4 (SUCTIONS) ×1
TUBE CONNECTING 12X1/4 (SUCTIONS) ×2 IMPLANT
WATER STERILE IRR 1000ML POUR (IV SOLUTION) ×3 IMPLANT
YANKAUER SUCT BULB TIP NO VENT (SUCTIONS) IMPLANT

## 2015-11-29 NOTE — Progress Notes (Signed)
R/B/A DISCUSSED WITH PT IN HOSPITAL.  PT VOICED UNDERSTANDING OF PLAN CONSENT SIGNED DAY OF SURGERY PT SEEN AND EXAMINED PRIOR TO OPERATIVE PROCEDURE/DAY OF SURGERY SITE MARKED. QUESTIONS ANSWERED WILL REMAIN AN INPATIENT FOLLOWING SURGERY 

## 2015-11-29 NOTE — Anesthesia Procedure Notes (Addendum)
Procedure Name: Intubation Date/Time: 11/29/2015 9:56 AM Performed by: Edmonia CaprioAUSTON, Tami Everett Pre-anesthesia Checklist: Patient identified, Emergency Drugs available, Suction available, Patient being monitored and Timeout performed Patient Re-evaluated:Patient Re-evaluated prior to inductionOxygen Delivery Method: Circle system utilized Preoxygenation: Pre-oxygenation with 100% oxygen Intubation Type: IV induction and Rapid sequence Laryngoscope Size: Glidescope and 3 Grade View: Grade II Tube type: Oral Tube size: 7.0 mm Number of attempts: 4 Airway Equipment and Method: Stylet,  Video-laryngoscopy,  Oral airway,  Bougie stylet and Fiberoptic brochoscope Placement Confirmation: ETT inserted through vocal cords under direct vision,  breath sounds checked- equal and bilateral and positive ETCO2 Secured at: 22 cm Tube secured with: Tape Difficulty Due To: Difficult Airway-  due to edematous airway, Difficult Airway- due to cervical collar and Difficult Airway- due to reduced neck mobility Future Recommendations: Recommend- induction with Newcom-acting agent, and alternative techniques readily available Comments: Patient preoxygenated and RSI.  DLx1 with glidescope large adult blade.  Able to visualize cords but not make angle to advance ETT into glottic opening.  Patient again masked with 100% Fi02.  Attempt #2 and #3 with Dr. Ivin Bootyrews  using FOB and glidescope for visualization- unable to advance ETT.  Patient's sats decreased; redosed Anectine and masked patient with oral airway and with 100% Fi02.  Additional CRNA help to bedside.  Attempt #4 with small adult glidescope blade and blue intubating stylet, successfully intubated by Glo HerringHeather Lee CRNA.  +BBS, rhonchi.  Teeth and lips unchanged, neck remained neutral throughout procedure.  Hard C-Collar replaced.  Arytenoids and cords were swollen (patient had been extubated day prior)- Decadron given IV.  Patient was an easy mask after induction.

## 2015-11-29 NOTE — Anesthesia Postprocedure Evaluation (Signed)
Anesthesia Post Note  Patient: Tami Everett  Procedure(s) Performed: Procedure(s) (LRB): OPEN REDUCTION INTERNAL FIXATION (ORIF) DISTAL RADIAL FRACTURE (Right)  Patient location during evaluation: PACU Anesthesia Type: General Level of consciousness: awake and alert Pain management: pain level controlled Vital Signs Assessment: post-procedure vital signs reviewed and stable Respiratory status: spontaneous breathing, nonlabored ventilation and respiratory function stable Cardiovascular status: blood pressure returned to baseline and stable Postop Assessment: no signs of nausea or vomiting Anesthetic complications: no    Last Vitals:  Vitals:   11/29/15 1130 11/29/15 1200  BP: (!) 150/90   Pulse: 89 100  Resp: 15 18  Temp: 36.8 C 36.6 C    Last Pain:  Vitals:   11/29/15 0800  TempSrc: Core (Comment)  PainSc: 4                  Brain Honeycutt A

## 2015-11-29 NOTE — Brief Op Note (Signed)
ORIF RIGHT RADIAL SHAFT/GALEAZZI CONTINUE WITH SUGAR TONG SPLINT X 2 WEEKS ICE/ELEVATE NWB RIGHT WRIST OK TO WB WITH PLATFORM WALKER CONTACT ME IF ANY QUESTIONS [phone]  JOB ID:
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
## 2015-11-29 NOTE — Transfer of Care (Signed)
Immediate Anesthesia Transfer of Care Note  Patient: Tami Everett  Procedure(s) Performed: Procedure(s): OPEN REDUCTION INTERNAL FIXATION (ORIF) DISTAL RADIAL FRACTURE (Right)  Patient Location: PACU  Anesthesia Type:General  Level of Consciousness: awake and sedated  Airway & Oxygen Therapy: Patient Spontanous Breathing and Patient connected to face mask oxygen  Post-op Assessment: Report given to RN, Post -op Vital signs reviewed and stable and Patient moving all extremities  Post vital signs: Reviewed and stable  Last Vitals:  Vitals:   11/29/15 0800 11/29/15 0820  BP: (!) 150/86   Pulse: (!) 114 (!) 107  Resp: 18 18  Temp: 37.8 C 37.9 C    Last Pain:  Vitals:   11/29/15 0800  TempSrc: Core (Comment)  PainSc: 4          Complications: No apparent anesthesia complications

## 2015-11-29 NOTE — Anesthesia Preprocedure Evaluation (Signed)
Anesthesia Evaluation  Patient identified by MRN, date of birth, ID band Patient awake    Reviewed: Allergy & Precautions, NPO status , Patient's Chart, lab work & pertinent test results  Airway Mallampati: I  TM Distance: >3 FB Neck ROM: Full    Dental  (+) Teeth Intact, Dental Advisory Given   Pulmonary    breath sounds clear to auscultation       Cardiovascular  Rhythm:Regular Rate:Normal     Neuro/Psych    GI/Hepatic   Endo/Other  Morbid obesity  Renal/GU      Musculoskeletal   Abdominal   Peds  Hematology   Anesthesia Other Findings   Reproductive/Obstetrics                             Anesthesia Physical Anesthesia Plan  ASA: I and emergent  Anesthesia Plan: General   Post-op Pain Management:    Induction: Intravenous  Airway Management Planned: Oral ETT  Additional Equipment:   Intra-op Plan:   Post-operative Plan: Extubation in OR  Informed Consent: I have reviewed the patients History and Physical, chart, labs and discussed the procedure including the risks, benefits and alternatives for the proposed anesthesia with the patient or authorized representative who has indicated his/her understanding and acceptance.   Dental advisory given  Plan Discussed with: CRNA, Anesthesiologist and Surgeon  Anesthesia Plan Comments:         Anesthesia Quick Evaluation

## 2015-11-29 NOTE — Progress Notes (Signed)
Pt in OR with Dr.Ortman. Plan for CT of right foot Dr.Hewitt to F/u for more treatment options Othro tech to apply stirrup splint to posterior splint.  Will monitor NWB RLE

## 2015-11-30 ENCOUNTER — Encounter (HOSPITAL_COMMUNITY): Payer: Self-pay | Admitting: Orthopedic Surgery

## 2015-11-30 ENCOUNTER — Other Ambulatory Visit: Payer: Self-pay | Admitting: Orthopedic Surgery

## 2015-11-30 ENCOUNTER — Inpatient Hospital Stay (HOSPITAL_COMMUNITY): Payer: Medicaid Other

## 2015-11-30 DIAGNOSIS — S92901A Unspecified fracture of right foot, initial encounter for closed fracture: Secondary | ICD-10-CM | POA: Diagnosis present

## 2015-11-30 DIAGNOSIS — J9600 Acute respiratory failure, unspecified whether with hypoxia or hypercapnia: Secondary | ICD-10-CM | POA: Diagnosis present

## 2015-11-30 DIAGNOSIS — S32009A Unspecified fracture of unspecified lumbar vertebra, initial encounter for closed fracture: Secondary | ICD-10-CM | POA: Diagnosis present

## 2015-11-30 DIAGNOSIS — S62101A Fracture of unspecified carpal bone, right wrist, initial encounter for closed fracture: Secondary | ICD-10-CM | POA: Diagnosis present

## 2015-11-30 DIAGNOSIS — S2241XA Multiple fractures of ribs, right side, initial encounter for closed fracture: Secondary | ICD-10-CM | POA: Diagnosis present

## 2015-11-30 DIAGNOSIS — F10129 Alcohol abuse with intoxication, unspecified: Secondary | ICD-10-CM | POA: Diagnosis present

## 2015-11-30 DIAGNOSIS — S82891A Other fracture of right lower leg, initial encounter for closed fracture: Secondary | ICD-10-CM | POA: Diagnosis present

## 2015-11-30 LAB — ABO/RH: ABO/RH(D): A POS

## 2015-11-30 MED ORDER — HYDROMORPHONE HCL 1 MG/ML IJ SOLN
0.5000 mg | INTRAMUSCULAR | Status: DC | PRN
  Administered 2015-11-30 – 2015-12-01 (×2): 0.5 mg via INTRAVENOUS
  Filled 2015-11-30 (×2): qty 1

## 2015-11-30 MED ORDER — DIPHENHYDRAMINE HCL 25 MG PO CAPS
25.0000 mg | ORAL_CAPSULE | Freq: Four times a day (QID) | ORAL | Status: DC | PRN
  Administered 2015-11-30 – 2015-12-08 (×8): 25 mg via ORAL
  Filled 2015-11-30 (×8): qty 1

## 2015-11-30 MED ORDER — POLYETHYLENE GLYCOL 3350 17 G PO PACK
17.0000 g | PACK | Freq: Every day | ORAL | Status: DC
  Administered 2015-11-30 – 2015-12-04 (×4): 17 g via ORAL
  Filled 2015-11-30 (×7): qty 1

## 2015-11-30 MED ORDER — DOCUSATE SODIUM 100 MG PO CAPS
100.0000 mg | ORAL_CAPSULE | Freq: Two times a day (BID) | ORAL | Status: DC
  Administered 2015-11-30 – 2015-12-08 (×13): 100 mg via ORAL
  Filled 2015-11-30 (×16): qty 1

## 2015-11-30 MED ORDER — OXYCODONE HCL 5 MG PO TABS
5.0000 mg | ORAL_TABLET | ORAL | Status: DC | PRN
  Administered 2015-11-30 – 2015-12-04 (×15): 15 mg via ORAL
  Administered 2015-12-04: 5 mg via ORAL
  Administered 2015-12-04 – 2015-12-07 (×7): 15 mg via ORAL
  Filled 2015-11-30 (×24): qty 3

## 2015-11-30 MED ORDER — ENOXAPARIN SODIUM 30 MG/0.3ML ~~LOC~~ SOLN
30.0000 mg | Freq: Two times a day (BID) | SUBCUTANEOUS | Status: DC
  Administered 2015-11-30 – 2015-12-01 (×4): 30 mg via SUBCUTANEOUS
  Filled 2015-11-30 (×4): qty 0.3
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
## 2015-12-01 DIAGNOSIS — S82141A Displaced bicondylar fracture of right tibia, initial encounter for closed fracture: Secondary | ICD-10-CM | POA: Diagnosis present

## 2015-12-01 LAB — BLOOD PRODUCT ORDER (VERBAL) VERIFICATION

## 2015-12-01 MED ORDER — TRAMADOL HCL 50 MG PO TABS
100.0000 mg | ORAL_TABLET | Freq: Four times a day (QID) | ORAL | Status: DC
  Administered 2015-12-01 – 2015-12-08 (×27): 100 mg via ORAL
  Filled 2015-12-01 (×28): qty 2

## 2015-12-01 MED ORDER — HYDROMORPHONE HCL 2 MG/ML IJ SOLN
0.5000 mg | INTRAMUSCULAR | Status: DC | PRN
  Administered 2015-12-01 – 2015-12-04 (×6): 0.5 mg via INTRAVENOUS
  Filled 2015-12-01 (×6): qty 1

## 2015-12-01 NOTE — Progress Notes (Signed)
Subjective: Pt c/o aching pain in the right foot, ankle and knee.  No pain in the left knee.  She is tolerating a regular diet.     Objective: Vital signs in last 24 hours: Temp:  [97.6 F (36.4 C)-98.4 F (36.9 C)] 97.6 F (36.4 C) (10/17 1300) Pulse Rate:  [92-100] 100 (10/17 1300) Resp:  [17-18] 18 (10/17 1300) BP: (122-136)/(64-83) 122/64 (10/17 1300) SpO2:  [100 %] 100 % (10/17 1300)  Intake/Output from previous day: 10/16 0701 - 10/17 0700 In: 750 [P.O.:750] Out: 1904 [Urine:1904] Intake/Output this shift: Total I/O In: 490 [P.O.:490] Out: -   No results for input(s): HGB in the last 72 hours. No results for input(s): WBC, RBC, HCT, PLT in the last 72 hours. No results for input(s): NA, K, CL, CO2, BUN, CREATININE, GLUCOSE, CALCIUM in the last 72 hours. No results for input(s): LABPT, INR in the last 72 hours.  PE:  R knee immobilized in long leg splint.  TTP at lateral aspect of knee.  Brisk cap refill at the toes.  Intact strength in PF and DF of the toes.  Sens to LT intact at the toes.  Xrays:  Right knee films show a small fracture at the lateral aspect of the tibial plateau.  No significant displacement.    L knee films are normal except for overlying staples.  Assessment/Plan: R foot fractures - to OR Thursday PM for ORIF of the navicular.  Closed treatment for the remaining foot fractures  R ankle fracture - closed treatment in NWB splint.  R tibial plateau fracture - stress exam in OR on Thursday to assess the ligaments.  Continue NWB until then.   Toni ArthursHEWITT, Kimon Loewen 12/01/2015, 4:25 PM
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
## 2015-12-02 ENCOUNTER — Other Ambulatory Visit: Payer: Self-pay | Admitting: Orthopedic Surgery

## 2015-12-02 ENCOUNTER — Encounter (HOSPITAL_COMMUNITY): Payer: Self-pay

## 2015-12-02 MED ORDER — ENOXAPARIN SODIUM 30 MG/0.3ML ~~LOC~~ SOLN: 30.0000 mg | Freq: Two times a day (BID) | SUBCUTANEOUS | Status: DC

## 2015-12-02 MED ORDER — ACETAMINOPHEN 325 MG PO TABS
650.0000 mg | ORAL_TABLET | Freq: Four times a day (QID) | ORAL | Status: DC
  Administered 2015-12-02 – 2015-12-08 (×24): 650 mg via ORAL
  Filled 2015-12-02 (×25): qty 2

## 2015-12-02 MED ORDER — POVIDONE-IODINE 10 % EX SWAB: 2.0000 "application " | Freq: Once | CUTANEOUS | Status: DC

## 2015-12-02 MED ORDER — SENNA 8.6 MG PO TABS
1.0000 | ORAL_TABLET | Freq: Two times a day (BID) | ORAL | Status: DC
  Administered 2015-12-02 – 2015-12-08 (×11): 8.6 mg via ORAL
  Filled 2015-12-02 (×12): qty 1

## 2015-12-02 MED ORDER — CHLORHEXIDINE GLUCONATE 4 % EX LIQD
60.0000 mL | Freq: Once | CUTANEOUS | Status: DC
  Administered 2015-12-03: 4 via TOPICAL

## 2015-12-02 MED ORDER — SODIUM CHLORIDE 0.9 % IV SOLN
INTRAVENOUS | Status: DC
  Administered 2015-12-03: via INTRAVENOUS

## 2015-12-02 MED ORDER — BISACODYL 10 MG RE SUPP: 10.0000 mg | Freq: Every day | RECTAL | Status: DC | PRN

## 2015-12-02 MED ORDER — ENOXAPARIN SODIUM 30 MG/0.3ML ~~LOC~~ SOLN
30.0000 mg | Freq: Two times a day (BID) | SUBCUTANEOUS | Status: AC
  Administered 2015-12-02: 30 mg via SUBCUTANEOUS
  Filled 2015-12-02 (×2): qty 0.3

## 2015-12-02 MED ORDER — CEFAZOLIN SODIUM-DEXTROSE 2-4 GM/100ML-% IV SOLN
2.0000 g | INTRAVENOUS | Status: AC
  Administered 2015-12-03: 2 g via INTRAVENOUS

## 2015-12-02 MED ORDER — MAGNESIUM HYDROXIDE 400 MG/5ML PO SUSP: 30.0000 mL | Freq: Every day | ORAL | Status: DC | PRN
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
## 2015-12-02 NOTE — Progress Notes (Signed)
Subjective: 3 Days Post-Op Procedure(s) (LRB): OPEN REDUCTION INTERNAL FIXATION (ORIF) DISTAL RADIAL FRACTURE (Right)  Patient reports pain as mild to moderate.  C/o mostly of R sided rib pain while trying to sleep.  Tolerating POs well.    Objective:   VITALS:  Temp:  [97.6 F (36.4 C)-98.5 F (36.9 C)] 98.2 F (36.8 C) (10/18 0442) Pulse Rate:  [84-100] 84 (10/18 0442) Resp:  [16-18] 16 (10/18 0442) BP: (115-125)/(64-83) 125/83 (10/18 0442) SpO2:  [100 %] 100 % (10/18 0442)  General: WDWN patient in NAD. Psych:  Appropriate mood and affect. Neuro:  A&O x 3, Moving all extremities, sensation intact to light touch HEENT:  EOMs intact Chest:  Even non-labored respirations Skin:  Splints C/D/I, no rashes or lesions.  R knee immobilized in long leg splint.  R UE in [place] arm splint Extremities: warm/dry, mild to moderate edema in bilateral LEs and R UE, no erythema or echymosis.  No lymphadenopathy. Pulses: Femoral 2+ MSK:  ROM: EHL/FHL intact, MMT: patient is able to perform quad set, (-) Homan's    LABS No results for input(s): HGB, WBC, PLT in the last 72 hours. No results for input(s): NA, K, CL, CO2, BUN, CREATININE, GLUCOSE in the last 72 hours. No results for input(s): LABPT, INR in the last 72 hours.   Assessment/Plan: 3 Days Post-Op Procedure(s) (LRB): OPEN REDUCTION INTERNAL FIXATION (ORIF) DISTAL RADIAL FRACTURE (Right)  R foot fractures - to OR Thursday PM for ORIF of navicular fx by Dr. Victorino DikeHewitt.  Closed treatment for remaining foot fractures. NPO after midnight. Hold blood thinners.  R ankle fracture - closed treatment NWB  R tibial plateau fx - stress exam in OR on Thursday to assess ligaments.  NWB.  Tami MartinezJustin Salsabeel Gorelick, PA-C, [facility] Office:  [phone]  Tami Everett 12/02/2015, 7:25 AM
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
## 2015-12-03 ENCOUNTER — Inpatient Hospital Stay (HOSPITAL_COMMUNITY): Payer: Medicaid Other | Admitting: Anesthesiology

## 2015-12-03 ENCOUNTER — Encounter (HOSPITAL_COMMUNITY): Payer: Self-pay | Admitting: Certified Registered Nurse Anesthetist

## 2015-12-03 ENCOUNTER — Encounter (HOSPITAL_COMMUNITY): Admission: EM | Disposition: A | Discharge status: Home / Self Care | Payer: Self-pay | Source: Home / Self Care

## 2015-12-03 HISTORY — PX: ORIF ANKLE FRACTURE: SHX5408

## 2015-12-03 HISTORY — PX: CLOSED REDUCTION TIBIA: SHX5115

## 2015-12-03 SURGERY — OPEN REDUCTION INTERNAL FIXATION (ORIF) ANKLE FRACTURE
Anesthesia: General | Laterality: Right

## 2015-12-03 MED ORDER — ONDANSETRON HCL 4 MG/2ML IJ SOLN: 4.0000 mg | Freq: Once | INTRAMUSCULAR | Status: DC | PRN

## 2015-12-03 MED ORDER — FENTANYL CITRATE (PF) 100 MCG/2ML IJ SOLN
25.0000 ug | INTRAMUSCULAR | Status: DC | PRN
  Administered 2015-12-03: 25 ug via INTRAVENOUS
  Administered 2015-12-03 (×2): 50 ug via INTRAVENOUS

## 2015-12-03 MED ORDER — LACTATED RINGERS IV SOLN
INTRAVENOUS | Status: DC
  Administered 2015-12-03: 21:00:00 via INTRAVENOUS

## 2015-12-03 MED ORDER — SUCCINYLCHOLINE CHLORIDE 20 MG/ML IJ SOLN
INTRAMUSCULAR | Status: DC | PRN
  Administered 2015-12-03: 160 mg via INTRAVENOUS

## 2015-12-03 MED ORDER — FENTANYL CITRATE (PF) 100 MCG/2ML IJ SOLN
INTRAMUSCULAR | Status: AC
  Filled 2015-12-03: qty 4

## 2015-12-03 MED ORDER — ONDANSETRON HCL 4 MG/2ML IJ SOLN
INTRAMUSCULAR | Status: DC | PRN
  Administered 2015-12-03: 4 mg via INTRAVENOUS

## 2015-12-03 MED ORDER — PHENYLEPHRINE 40 MCG/ML (10ML) SYRINGE FOR IV PUSH (FOR BLOOD PRESSURE SUPPORT)
PREFILLED_SYRINGE | INTRAVENOUS | Status: AC
  Filled 2015-12-03: qty 10

## 2015-12-03 MED ORDER — SUCCINYLCHOLINE CHLORIDE 200 MG/10ML IV SOSY
PREFILLED_SYRINGE | INTRAVENOUS | Status: AC
  Filled 2015-12-03: qty 10

## 2015-12-03 MED ORDER — PROPOFOL 10 MG/ML IV BOLUS
INTRAVENOUS | Status: AC
  Filled 2015-12-03: qty 20

## 2015-12-03 MED ORDER — FENTANYL CITRATE (PF) 100 MCG/2ML IJ SOLN
INTRAMUSCULAR | Status: AC
  Filled 2015-12-03: qty 2

## 2015-12-03 MED ORDER — DEXAMETHASONE SODIUM PHOSPHATE 10 MG/ML IJ SOLN
INTRAMUSCULAR | Status: DC | PRN
  Administered 2015-12-03: 5 mg via INTRAVENOUS

## 2015-12-03 MED ORDER — LACTATED RINGERS IV SOLN
INTRAVENOUS | Status: DC
  Administered 2015-12-03 (×2): via INTRAVENOUS

## 2015-12-03 MED ORDER — BUPIVACAINE-EPINEPHRINE 0.5% -1:200000 IJ SOLN
INTRAMUSCULAR | Status: DC | PRN
  Administered 2015-12-03: 20 mL

## 2015-12-03 MED ORDER — METOPROLOL TARTRATE 5 MG/5ML IV SOLN
INTRAVENOUS | Status: AC
  Filled 2015-12-03: qty 5

## 2015-12-03 MED ORDER — MIDAZOLAM HCL 5 MG/5ML IJ SOLN
INTRAMUSCULAR | Status: DC | PRN
Start: 2015-12-03 — End: 2015-12-03
  Administered 2015-12-03: 2 mg via INTRAVENOUS

## 2015-12-03 MED ORDER — OXYCODONE HCL 5 MG/5ML PO SOLN: 5.0000 mg | Freq: Once | ORAL | Status: AC | PRN

## 2015-12-03 MED ORDER — ROCURONIUM BROMIDE 10 MG/ML (PF) SYRINGE
PREFILLED_SYRINGE | INTRAVENOUS | Status: AC
  Filled 2015-12-03: qty 30

## 2015-12-03 MED ORDER — ONDANSETRON HCL 4 MG/2ML IJ SOLN
INTRAMUSCULAR | Status: AC
  Filled 2015-12-03: qty 4

## 2015-12-03 MED ORDER — ENOXAPARIN SODIUM 30 MG/0.3ML ~~LOC~~ SOLN
30.0000 mg | Freq: Two times a day (BID) | SUBCUTANEOUS | Status: DC
  Administered 2015-12-04 – 2015-12-08 (×9): 30 mg via SUBCUTANEOUS
  Filled 2015-12-03 (×9): qty 0.3

## 2015-12-03 MED ORDER — LABETALOL HCL 5 MG/ML IV SOLN
INTRAVENOUS | Status: DC | PRN
  Administered 2015-12-03 (×2): 10 mg via INTRAVENOUS

## 2015-12-03 MED ORDER — FENTANYL CITRATE (PF) 100 MCG/2ML IJ SOLN
INTRAMUSCULAR | Status: DC | PRN
Start: 2015-12-03 — End: 2015-12-03
  Administered 2015-12-03: 50 ug via INTRAVENOUS
  Administered 2015-12-03: 100 ug via INTRAVENOUS
  Administered 2015-12-03: 50 ug via INTRAVENOUS
  Administered 2015-12-03: 100 ug via INTRAVENOUS
  Administered 2015-12-03: 50 ug via INTRAVENOUS
  Administered 2015-12-03: 150 ug via INTRAVENOUS

## 2015-12-03 MED ORDER — PROPOFOL 10 MG/ML IV BOLUS
INTRAVENOUS | Status: DC | PRN
  Administered 2015-12-03: 160 mg via INTRAVENOUS

## 2015-12-03 MED ORDER — METOPROLOL TARTRATE 5 MG/5ML IV SOLN
INTRAVENOUS | Status: DC | PRN
  Administered 2015-12-03 (×2): 2 mg via INTRAVENOUS

## 2015-12-03 MED ORDER — BUPIVACAINE-EPINEPHRINE (PF) 0.5% -1:200000 IJ SOLN
INTRAMUSCULAR | Status: AC
  Filled 2015-12-03: qty 30

## 2015-12-03 MED ORDER — LABETALOL HCL 5 MG/ML IV SOLN
INTRAVENOUS | Status: AC
  Filled 2015-12-03: qty 4

## 2015-12-03 MED ORDER — LIDOCAINE HCL (CARDIAC) 20 MG/ML IV SOLN
INTRAVENOUS | Status: DC | PRN
Start: 2015-12-03 — End: 2015-12-03
  Administered 2015-12-03: 60 mg via INTRAVENOUS

## 2015-12-03 MED ORDER — LIDOCAINE 2% (20 MG/ML) 5 ML SYRINGE
INTRAMUSCULAR | Status: AC
  Filled 2015-12-03: qty 20

## 2015-12-03 MED ORDER — SUGAMMADEX SODIUM 200 MG/2ML IV SOLN
INTRAVENOUS | Status: AC
  Filled 2015-12-03: qty 2

## 2015-12-03 MED ORDER — MIDAZOLAM HCL 2 MG/2ML IJ SOLN
INTRAMUSCULAR | Status: AC
  Filled 2015-12-03: qty 2

## 2015-12-03 MED ORDER — OXYCODONE HCL 5 MG PO TABS
5.0000 mg | ORAL_TABLET | Freq: Once | ORAL | Status: AC | PRN
  Administered 2015-12-03: 5 mg via ORAL

## 2015-12-03 MED ORDER — OXYCODONE HCL 5 MG PO TABS
ORAL_TABLET | ORAL | Status: AC
  Filled 2015-12-03: qty 1

## 2015-12-03 MED ORDER — DEXAMETHASONE SODIUM PHOSPHATE 10 MG/ML IJ SOLN
INTRAMUSCULAR | Status: AC
  Filled 2015-12-03: qty 2

## 2015-12-03 SURGICAL SUPPLY — 71 items
BANDAGE ESMARK 6X9 LF (GAUZE/BANDAGES/DRESSINGS) ×2 IMPLANT
BAR CARBON EXFX 6X150 (EXFIX) ×4 IMPLANT
BIT DRILL 2.0 SM (BIT) ×3 IMPLANT
BIT DRILL 2.0MM SM (BIT) ×1
BLADE SURG 15 STRL LF DISP TIS (BLADE) ×2 IMPLANT
BLADE SURG 15 STRL SS (BLADE) ×2
BNDG COHESIVE 4X5 TAN STRL (GAUZE/BANDAGES/DRESSINGS) ×4 IMPLANT
BNDG COHESIVE 6X5 TAN STRL LF (GAUZE/BANDAGES/DRESSINGS) ×4 IMPLANT
BNDG ESMARK 6X9 LF (GAUZE/BANDAGES/DRESSINGS) ×4
CANISTER SUCT 3000ML PPV (MISCELLANEOUS) ×4 IMPLANT
CHLORAPREP W/TINT 26ML (MISCELLANEOUS) ×4 IMPLANT
CLAMP BAR/PIN TO BAR/PIN SM (EXFIX) ×4 IMPLANT
COVER SURGICAL LIGHT HANDLE (MISCELLANEOUS) ×4 IMPLANT
CUFF TOURNIQUET SINGLE 34IN LL (TOURNIQUET CUFF) ×4 IMPLANT
CUFF TOURNIQUET SINGLE 44IN (TOURNIQUET CUFF) IMPLANT
DRAPE OEC MINIVIEW 54X84 (DRAPES) ×4 IMPLANT
DRAPE U-SHAPE 47X51 STRL (DRAPES) ×4 IMPLANT
DRSG ADAPTIC 3X8 NADH LF (GAUZE/BANDAGES/DRESSINGS) IMPLANT
DRSG MEPITEL 4X7.2 (GAUZE/BANDAGES/DRESSINGS) ×4 IMPLANT
DRSG PAD ABDOMINAL 8X10 ST (GAUZE/BANDAGES/DRESSINGS) ×8 IMPLANT
ELECT REM PT RETURN 9FT ADLT (ELECTROSURGICAL) ×4
ELECTRODE REM PT RTRN 9FT ADLT (ELECTROSURGICAL) ×2 IMPLANT
GAUZE SPONGE 4X4 12PLY STRL (GAUZE/BANDAGES/DRESSINGS) IMPLANT
GLOVE BIO SURGEON STRL SZ8 (GLOVE) ×4 IMPLANT
GLOVE BIOGEL PI IND STRL 8 (GLOVE) ×2 IMPLANT
GLOVE BIOGEL PI INDICATOR 8 (GLOVE) ×2
GLOVE ECLIPSE 7.5 STRL STRAW (GLOVE) ×4 IMPLANT
GOWN STRL REUS W/ TWL LRG LVL3 (GOWN DISPOSABLE) ×2 IMPLANT
GOWN STRL REUS W/ TWL XL LVL3 (GOWN DISPOSABLE) ×4 IMPLANT
GOWN STRL REUS W/TWL LRG LVL3 (GOWN DISPOSABLE) ×2
GOWN STRL REUS W/TWL XL LVL3 (GOWN DISPOSABLE) ×4
IMMOBILIZER KNEE 22 UNIV (SOFTGOODS) ×4 IMPLANT
K-WIRE ACE 1.6X6 (WIRE) ×24
KIT BASIN OR (CUSTOM PROCEDURE TRAY) ×4 IMPLANT
KIT ROOM TURNOVER OR (KITS) ×4 IMPLANT
KWIRE ACE 1.6X6 (WIRE) ×12 IMPLANT
NEEDLE 22X1 1/2 (OR ONLY) (NEEDLE) IMPLANT
NS IRRIG 1000ML POUR BTL (IV SOLUTION) ×4 IMPLANT
PACK ORTHO EXTREMITY (CUSTOM PROCEDURE TRAY) ×4 IMPLANT
PAD ARMBOARD 7.5X6 YLW CONV (MISCELLANEOUS) ×8 IMPLANT
PAD CAST 4YDX4 CTTN HI CHSV (CAST SUPPLIES) ×2 IMPLANT
PADDING CAST COTTON 4X4 STRL (CAST SUPPLIES) ×2
PADDING CAST COTTON 6X4 STRL (CAST SUPPLIES) ×4 IMPLANT
PIN 4MMX120X25 (EXFIX) ×4 IMPLANT
PIN CLAMP 1BAR 33MM EXFIX (EXFIX) ×4 IMPLANT
PIN HALF 2.5MM 120X25MM EXFIX (EXFIX) IMPLANT
PIN HALF 3.0MM 120X25MM EXFIX (EXFIX) ×8 IMPLANT
PLATE SPIDER 16 (Washer) ×4 IMPLANT
SCREW ACE CAN 4.0 36M (Screw) ×4 IMPLANT
SPLINT PLASTER CAST XFAST 5X30 (CAST SUPPLIES) ×2 IMPLANT
SPLINT PLASTER XFAST SET 5X30 (CAST SUPPLIES) ×2
SPONGE GAUZE 4X4 12PLY STER LF (GAUZE/BANDAGES/DRESSINGS) ×4 IMPLANT
SPONGE LAP 18X18 X RAY DECT (DISPOSABLE) ×4 IMPLANT
STAPLER VISISTAT 35W (STAPLE) IMPLANT
SUCTION FRAZIER HANDLE 10FR (MISCELLANEOUS) ×2
SUCTION TUBE FRAZIER 10FR DISP (MISCELLANEOUS) ×2 IMPLANT
SUT ETHILON 2 0 PSLX (SUTURE) ×4 IMPLANT
SUT MNCRL AB 3-0 PS2 18 (SUTURE) IMPLANT
SUT PROLENE 3 0 PS 2 (SUTURE) ×4 IMPLANT
SUT VIC AB 0 CT1 27 (SUTURE) ×2
SUT VIC AB 0 CT1 27XBRD ANBCTR (SUTURE) ×2 IMPLANT
SUT VIC AB 2-0 CT1 27 (SUTURE) ×4
SUT VIC AB 2-0 CT1 TAPERPNT 27 (SUTURE) ×4 IMPLANT
SUT VIC AB 3-0 PS2 18 (SUTURE) ×2
SUT VIC AB 3-0 PS2 18XBRD (SUTURE) ×2 IMPLANT
SYR CONTROL 10ML LL (SYRINGE) IMPLANT
TOWEL OR 17X24 6PK STRL BLUE (TOWEL DISPOSABLE) ×4 IMPLANT
TOWEL OR 17X26 10 PK STRL BLUE (TOWEL DISPOSABLE) ×4 IMPLANT
TUBE CONNECTING 12'X1/4 (SUCTIONS) ×1
TUBE CONNECTING 12X1/4 (SUCTIONS) ×3 IMPLANT
WATER STERILE IRR 1000ML POUR (IV SOLUTION) ×4 IMPLANT

## 2015-12-03 NOTE — Transfer of Care (Signed)
Immediate Anesthesia Transfer of Care Note  Patient: Tami Everett  Procedure(s) Performed: Procedure(s): OPEN REDUCTION INTERNAL FIXATION (ORIF) RIGHT NAVICULAR POSSIBLE APPLICATION EXTERNAL FIXATOR (Right) CLOSED REDUCTION TIBIA Plateau   Patient Location: PACU  Anesthesia Type:General  Level of Consciousness: awake, oriented and patient cooperative  Airway & Oxygen Therapy: Patient Spontanous Breathing and Patient connected to nasal cannula oxygen  Post-op Assessment: Report given to RN, Post -op Vital signs reviewed and stable and Patient moving all extremities X 4  Post vital signs: Reviewed and stable  Last Vitals:  Vitals:   12/03/15 0645 12/03/15 1419  BP: 127/78 133/86  Pulse: 75   Resp: 18 16  Temp: 36.7 C 36.8 C    Last Pain:  Vitals:   12/03/15 0648  TempSrc:   PainSc: Asleep      Patients Stated Pain Goal: 3 (12/03/15 0351)  Complications: No apparent anesthesia complications

## 2015-12-03 NOTE — Progress Notes (Addendum)
Received patient from PACU with right ankle external fixator, cast and compression wrap at RLE.  Patient AOx4, VS stable, O2 Sat at 96% on RA, and pain at 10/10.  Administered PRN pain medication Dilaudid 0.5 mg Inj. after patient voided and had BM using BSC.  Patient now resting on bed comfortably with a family member at bedside.
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
## 2015-12-03 NOTE — Anesthesia Procedure Notes (Signed)
Procedure Name: Intubation Date/Time: 12/03/2015 4:52 PM Performed by: Faustino CongressWHITE, Asra Gambrel TENA Benna Arno Pre-anesthesia Checklist: Patient identified, Emergency Drugs available, Suction available and Patient being monitored Patient Re-evaluated:Patient Re-evaluated prior to inductionOxygen Delivery Method: Circle System Utilized Preoxygenation: Pre-oxygenation with 100% oxygen Intubation Type: IV induction and Rapid sequence Ventilation: Mask ventilation without difficulty Laryngoscope Size: Mac and 3 Grade View: Grade I Tube type: Oral Tube size: 7.0 mm Number of attempts: 1 Airway Equipment and Method: Stylet Placement Confirmation: ETT inserted through vocal cords under direct vision,  positive ETCO2 and breath sounds checked- equal and bilateral Secured at: 22 cm Tube secured with: Tape Dental Injury: Teeth and Oropharynx as per pre-operative assessment

## 2015-12-03 NOTE — Interval H&P Note (Signed)
History and Physical Interval Note:  12/03/2015 4:32 PM  Tami Everett  has presented today for surgery, with the diagnosis of RIGHT NAVICULAR FRACTURE  The various methods of treatment have been discussed with the patient and family. After consideration of risks, benefits and other options for treatment, the patient has consented to  Procedure(s): OPEN REDUCTION INTERNAL FIXATION (ORIF) RIGHT NAVICULAR POSSIBLE APPLICATION EXTERNAL FIXATOR (Right) as a surgical intervention .  The patient's history has been reviewed, patient examined, no change in status, stable for surgery.  I have reviewed the patient's chart and labs.  Questions were answered to the patient's satisfaction.    The risks and benefits of the alternative treatment options have been discussed in detail.  The patient wishes to proceed with surgery and specifically understands risks of bleeding, infection, nerve damage, blood clots, need for additional surgery, amputation and death.    Toni ArthursHEWITT, Yulanda Diggs
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
## 2015-12-03 NOTE — Anesthesia Preprocedure Evaluation (Signed)
Anesthesia Evaluation  Patient identified by MRN, date of birth, ID band Patient awake    Reviewed: Allergy & Precautions, NPO status , Patient's Chart, lab work & pertinent test results  Airway Mallampati: II   Neck ROM: Full    Dental  (+) Teeth Intact, Dental Advisory Given   Pulmonary     + decreased breath sounds      Cardiovascular  Rhythm:Regular Rate:Normal     Neuro/Psych    GI/Hepatic   Endo/Other    Renal/GU      Musculoskeletal   Abdominal   Peds  Hematology   Anesthesia Other Findings   Reproductive/Obstetrics                             Anesthesia Physical Anesthesia Plan  ASA: III  Anesthesia Plan: General   Post-op Pain Management:    Induction: Intravenous  Airway Management Planned: Oral ETT and Video Laryngoscope Planned  Additional Equipment:   Intra-op Plan:   Post-operative Plan: Extubation in OR  Informed Consent: I have reviewed the patients History and Physical, chart, labs and discussed the procedure including the risks, benefits and alternatives for the proposed anesthesia with the patient or authorized representative who has indicated his/her understanding and acceptance.   Dental advisory given  Plan Discussed with: CRNA and Anesthesiologist  Anesthesia Plan Comments:         Anesthesia Quick Evaluation

## 2015-12-03 NOTE — Anesthesia Postprocedure Evaluation (Signed)
Anesthesia Post Note  Patient: Cherylynn RidgesViola S Vigilante  Procedure(s) Performed: Procedure(s) (LRB): OPEN REDUCTION INTERNAL FIXATION (ORIF) RIGHT NAVICULAR POSSIBLE APPLICATION EXTERNAL FIXATOR (Right) CLOSED REDUCTION TIBIA Plateau   Patient location during evaluation: PACU Anesthesia Type: General Level of consciousness: awake and alert Pain management: pain level controlled Vital Signs Assessment: post-procedure vital signs reviewed and stable Respiratory status: spontaneous breathing, nonlabored ventilation, respiratory function stable and patient connected to nasal cannula oxygen Cardiovascular status: blood pressure returned to baseline and stable Postop Assessment: no signs of nausea or vomiting Anesthetic complications: no    Last Vitals:  Vitals:   12/03/15 1907 12/03/15 1919  BP: 115/89 135/81  Pulse: (!) 113 81  Resp: (!) 21 15  Temp:      Last Pain:  Vitals:   12/03/15 1930  TempSrc:   PainSc: 10-Worst pain ever                 Rhilynn Preyer,W. EDMOND
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
## 2015-12-03 NOTE — Brief Op Note (Signed)
11/28/2015 - 12/03/2015  6:48 PM  PATIENT:  Tami Everett  25 y.o. female  PRE-OPERATIVE DIAGNOSIS: 1.  Right navicular fracture      2.  Right talus fracture      3.  Right ankle bimal fracture      4.  Right cuboid fracture      5.  Right 4th MT fracture      6.  Right tibial plateau fracture      7.  Right calcaneus fracture      8.  Right medial and lateral cuneiform fractures   POST-OPERATIVE DIAGNOSIS:  same  Procedure(s):  1.  Open treatment of right navicular fracture with internal fixation   2.  Application of uniplane external fixator   3.  Ap and lateral xrays of the right foot   4.  Exam under anesthesia of the right knee   5.  Closed treatment of right tibial plateau fracture without manipulation   6.  Closed treatment of right ankle bimal fracture without manipulation   7.  Closed treatment of right foot calcaneus, talus, cuboid, metatarsal and cuneiform fractures without manipulation  SURGEON:  Toni ArthursJohn Lajoya Dombek, MD  ASSISTANT: n/a  ANESTHESIA:   General  EBL:  minimal   TOURNIQUET:   Total Tourniquet Time Documented: Thigh (Right) - 85 minutes Total: Thigh (Right) - 85 minutes  COMPLICATIONS:  None apparent  DISPOSITION:  Extubated, awake and stable to recovery.  DICTATION ID:  [id number]

## 2015-12-04 ENCOUNTER — Encounter (HOSPITAL_COMMUNITY): Payer: Self-pay | Admitting: Orthopedic Surgery

## 2015-12-04 MED ORDER — METHOCARBAMOL 500 MG PO TABS
500.0000 mg | ORAL_TABLET | Freq: Three times a day (TID) | ORAL | Status: DC | PRN
Start: 2015-12-04 — End: 2015-12-08
  Administered 2015-12-04 – 2015-12-05 (×3): 500 mg via ORAL
  Filled 2015-12-04 (×3): qty 1

## 2015-12-04 MED ORDER — SIMETHICONE 80 MG PO CHEW
80.0000 mg | CHEWABLE_TABLET | Freq: Once | ORAL | Status: AC
  Administered 2015-12-04: 80 mg via ORAL
  Filled 2015-12-04: qty 1

## 2015-12-04 NOTE — Progress Notes (Signed)
Subjective: 1 Day Post-Op Procedure(s) (LRB): OPEN REDUCTION INTERNAL FIXATION (ORIF) RIGHT NAVICULAR POSSIBLE APPLICATION EXTERNAL FIXATOR (Right) CLOSED REDUCTION TIBIA Plateau   Patient reports pain as mild to moderate.  Tolerating POs well.  Denies fever, chills, N/V.  Admits to BM.  Objective:   VITALS:  Temp:  [97.6 F (36.4 C)-99 F (37.2 C)] 98 F (36.7 C) (10/20 0500) Pulse Rate:  [78-113] 78 (10/20 0500) Resp:  [11-28] 18 (10/20 0500) BP: (115-147)/(79-115) 142/87 (10/20 0500) SpO2:  [93 %-100 %] 100 % (10/20 0500)  General: WDWN patient in NAD. Psych:  Appropriate mood and affect. Neuro:  A&O x 3, Moving all extremities, sensation intact to light touch HEENT:  EOMs intact Chest:  Even non-labored respirations Skin: Dressing/Ex-fix/knee immobilizer/[place] arm splint C/D/I, no rashes or lesions Extremities: warm/dry, mild edema, no erythema, or echymosis.  No lymphadenopathy. Pulses: Femoral 2+ MSK:  ROM: EHL/FHL intact, MMT: patient is able to perform quad set    LABS No results for input(s): HGB, WBC, PLT in the last 72 hours. No results for input(s): NA, K, CL, CO2, BUN, CREATININE, GLUCOSE in the last 72 hours. No results for input(s): LABPT, INR in the last 72 hours.   Assessment/Plan: 1 Day Post-Op Procedure(s) (LRB): OPEN REDUCTION INTERNAL FIXATION (ORIF) RIGHT NAVICULAR POSSIBLE APPLICATION EXTERNAL FIXATOR (Right) CLOSED REDUCTION TIBIA Plateau   NWB RLE Up with therapy Plan for 2 week outpatient post-op visit with Dr. Reed BreechHewitt  Justin Ollis, PA-C, ATC [facility]:  [phone]
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
## 2015-12-04 NOTE — Progress Notes (Signed)
Follow-up visit post-op. Patient on phone, would welcome visit later. Chaplain available for follow-through.   12/04/15 1300  Clinical Encounter Type  Visited With Patient not available  Visit Type Follow-up;Psychological support;Spiritual support;Post-op  Referral From Chaplain  Stress Factors  Patient Stress Factors Health changes
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
## 2015-12-04 NOTE — Discharge Instructions (Signed)
Tami Hewitt, MD °[facility] ° °Please read the following information regarding your care after surgery. ° °Medications  °You only need a prescription for the narcotic pain medicine (ex. oxycodone, Percocet, Norco).  All of the other medicines listed below are available over the counter. °X acetominophen (Tylenol) 650 mg every 4-6 hours as you need for minor pain °X oxycodone as prescribed for moderate to severe pain °?  ° °Narcotic pain medicine (ex. oxycodone, Percocet, Vicodin) will cause constipation.  To prevent this problem, take the following medicines while you are taking any pain medicine. °X docusate sodium (Colace) 100 mg twice a day X senna (Senokot) 2 tablets twice a day ° °X To help prevent blood clots, take a baby aspirin (81 mg) twice a day after surgery until you are allowed to initiate weightbearing on your operative extremity.  You should also get up every hour while you are awake to move around.   ° °Weight Bearing °X Do not bear any weight on the operated leg or foot. ° °Cast / Splint / Dressing °X Keep your splint or cast clean and dry.  Don’t put anything (coat hanger, pencil, etc) down inside of it.  If it gets damp, use a hair dryer on the cool setting to dry it.  If it gets soaked, call the office to schedule an appointment for a cast change. °  ° °After your dressing, cast or splint is removed; you may shower, but do not soak or scrub the wound.  Allow the water to run over it, and then gently pat it dry. ° °Swelling °It is normal for you to have swelling where you had surgery.  To reduce swelling and pain, keep your toes above your nose for at least 3 days after surgery.  It may be necessary to keep your foot or leg elevated for several weeks.  If it hurts, it should be elevated. ° °Follow Up °Call my office at [phone] when you are discharged from the hospital or surgery center to schedule an appointment to be seen two weeks after surgery. ° °Call my office at  [phone] if you develop a fever >101.5° F, nausea, vomiting, bleeding from the surgical site or severe pain.   ° ° °
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
## 2015-12-04 NOTE — Progress Notes (Signed)
Stopped back by to see pt, whom I was happy to see had 3 adult family members in her room and her little girl asleep in the bed with her. Everyone was in a good mood, and we joked around a bit. Pt asked me to come back at first tomorrow when she didn't have so many visitors, but then thought she'd have family a good part of weekend. I told her I'd return Tuesday when am back at work after weekend call, and she said that would be good, she wouldn't have as much family intervention then. Chaplain available for follow-up.   12/04/15 1700  Clinical Encounter Type  Visited With Patient and family together  Visit Type Follow-up;Psychological support;Spiritual support;Social support;Post-op  Referral From Chaplain  Spiritual Encounters  Spiritual Needs Emotional;Other (Comment)  Stress Factors  Patient Stress Factors Health changes (spiritual support)  Family Stress Factors Family relationships;Health changes

## 2015-12-05 ENCOUNTER — Inpatient Hospital Stay (HOSPITAL_COMMUNITY): Payer: Medicaid Other

## 2015-12-05 NOTE — Progress Notes (Signed)
Orthopedic Tech Progress Note Patient Details:  [name] [birth year] [id number]  Ortho Devices Type of Ortho Device: Ace wrap, Quincy Simmondsobert Jones splint Ortho Device/Splint Location: RLE Ortho Device/Splint Interventions: Application   Saul FordyceJennifer C Rodman Recupero 12/05/2015, 2:11 PM

## 2015-12-05 NOTE — Progress Notes (Signed)
OT Cancellation Note  Patient Details Name: [name] MRN: [id number] DOB: [birth year]   Cancelled Treatment:    Reason Eval/Treat Not Completed: Pain limiting ability to participate;Other (comment). Pt. States L foot pain is too severe for any functional mobility.  Reports rn and md aware and xray pending.  States she would like to wait until she knows the status of L foot before proceeding with therapy session.  Will check back as able.    Robet LeuMorris, Deondrae Mcgrail Lorraine, COTA/L 12/05/2015, 10:45 AM

## 2015-12-05 NOTE — Progress Notes (Signed)
Subjective: 2 Days Post-Op Procedure(s) (LRB): OPEN REDUCTION INTERNAL FIXATION (ORIF) RIGHT NAVICULAR POSSIBLE APPLICATION EXTERNAL FIXATOR (Right) CLOSED REDUCTION TIBIA Plateau  Patient reports pain as 2 on 0-10 scale. No major post-Op problems.    Objective: Vital signs in last 24 hours: Temp:  [98.3 F (36.8 C)-98.4 F (36.9 C)] 98.3 F (36.8 C) (10/21 0500) Pulse Rate:  [79-83] 81 (10/21 0500) Resp:  [18] 18 (10/21 0500) BP: (121-141)/(78-94) 141/94 (10/21 0500) SpO2:  [100 %] 100 % (10/21 0500)  Intake/Output from previous day: 10/20 0701 - 10/21 0700 In: 480 [P.O.:480] Out: -  Intake/Output this shift: No intake/output data recorded.  No results for input(s): HGB in the last 72 hours. No results for input(s): WBC, RBC, HCT, PLT in the last 72 hours. No results for input(s): NA, K, CL, CO2, BUN, CREATININE, GLUCOSE, CALCIUM in the last 72 hours. No results for input(s): LABPT, INR in the last 72 hours.  Circulation intact.  Assessment/Plan: 2 Days Post-Op Procedure(s) (LRB): OPEN REDUCTION INTERNAL FIXATION (ORIF) RIGHT NAVICULAR POSSIBLE APPLICATION EXTERNAL FIXATOR (Right) CLOSED REDUCTION TIBIA Plateau  Circulation intact.  Tami Everett A 12/05/2015, 8:20 AM
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
## 2015-12-05 NOTE — Progress Notes (Signed)
PT is recommending HHPT, RW, and a W/C. Met with pt at bedside. Discussed PT recommendations. She stated that PT discussed with her going to inpt rehab. She is waiting for PT to see her again. Will continue to f/u to assist with the D/C plan.

## 2015-12-05 NOTE — Progress Notes (Signed)
PT Cancellation Note  Patient Details Name: Tami Everett MRN: [id number] DOB: November 05, 1990   Cancelled Treatment:    Reason Eval/Treat Not Completed: Medical issues which prohibited therapy. Need clarification regarding weight bearing status on L LE following recent x-ray earlier today (12/05/15). Pt reported she was told that her foot is "broken" but that she was allowed to WB to ambulate to bathroom and within her room. PT will f/u with pt as appropriate. Please clarify WB'ing status of L LE.   Tami Everett 12/05/2015, 3:02 PM Tami Everett, PT, DPT [phone]

## 2015-12-06 ENCOUNTER — Inpatient Hospital Stay (HOSPITAL_COMMUNITY): Payer: Medicaid Other
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
## 2015-12-06 NOTE — Progress Notes (Signed)
   Subjective: 3 Days Post-Op Procedure(s) (LRB): OPEN REDUCTION INTERNAL FIXATION (ORIF) RIGHT NAVICULAR POSSIBLE APPLICATION EXTERNAL FIXATOR (Right) CLOSED REDUCTION TIBIA Plateau   Diagnosed with probable lateral malleolus avulsion fracture to left ankle yesterday C/o mild pain and that the splint has an odor Denies any new symptoms or issues Patient reports pain as moderate.  Objective:   VITALS:   Vitals:   12/06/15 0451 12/06/15 0750  BP: 126/77 129/73  Pulse: 78 78  Resp:    Temp: 98.4 F (36.9 C) 98.2 F (36.8 C)    Right upper extremity in sugar tong splint, nv intact distally Left lower extremity with new splint in place, nv intact distally Right lower extremity with ex fix to ankle and knee immobilizer in place No drainage  nv intact distally  LABS No results for input(s): HGB, HCT, WBC, PLT in the last 72 hours.  No results for input(s): NA, K, BUN, CREATININE, GLUCOSE in the last 72 hours.   Assessment/Plan: 3 Days Post-Op Procedure(s) (LRB): OPEN REDUCTION INTERNAL FIXATION (ORIF) RIGHT NAVICULAR POSSIBLE APPLICATION EXTERNAL FIXATOR (Right) CLOSED REDUCTION TIBIA Plateau  Continue non weight bearing to right lower extremity Non weight bearing right upper extremity Pain management as needed Will continue to monitor her progress    Alphonsa OverallBrad Laylaa Guevarra, MPAS, PA-C  12/06/2015, 8:30 AM
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
## 2015-12-07 NOTE — Progress Notes (Signed)
Subjective: 4 Days Post-Op Procedure(s) (LRB): OPEN REDUCTION INTERNAL FIXATION (ORIF) RIGHT NAVICULAR POSSIBLE APPLICATION EXTERNAL FIXATOR (Right) CLOSED REDUCTION TIBIA Plateau   Patient reports pain as mild to moderate.  Tolerating POs well.  Admits to BM.  C/o of iron smell from RLE dressing.  Denies fever, chills, N/V.  Objective:   VITALS:  Temp:  [97.9 F (36.6 C)-98.9 F (37.2 C)] 97.9 F (36.6 C) (10/23 0522) Pulse Rate:  [42-82] 80 (10/23 0522) Resp:  [16] 16 (10/23 0522) BP: (120-136)/(73-85) 120/73 (10/23 0522) SpO2:  [96 %-100 %] 100 % (10/23 0522)  General: WDWN patient in NAD. Psych:  Appropriate mood and affect. Neuro:  A&O x 3, Moving all extremities, sensation intact to light touch HEENT:  EOMs intact Chest:  Even non-labored respirations Skin:  Dressing, ex-fix on RLE, post splint on LLE C/D/I, no rashes or lesions Extremities: warm/dry, mild edema, no erythema or echymosis.  No lymphadenopathy. Pulses: Popliteus 2+ MSK:  ROM: EHL/FHL intact, MMT: patient can perform quad set, (-) Homan's    LABS No results for input(s): HGB, WBC, PLT in the last 72 hours. No results for input(s): NA, K, CL, CO2, BUN, CREATININE, GLUCOSE in the last 72 hours. No results for input(s): LABPT, INR in the last 72 hours.   Assessment/Plan: 4 Days Post-Op Procedure(s) (LRB): OPEN REDUCTION INTERNAL FIXATION (ORIF) RIGHT NAVICULAR POSSIBLE APPLICATION EXTERNAL FIXATOR (Right) CLOSED REDUCTION TIBIA Plateau  L lateral malleolus avulsion fx  NWB RLE WBAT in CAM boot on LLE CAM boot ordered Plan for 2 week outpatient post-op visit with Dr. Victorino DikeHewitt.  Alfredo MartinezJustin Nhan Qualley, PA-C, [facility] Office:  [phone]
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
## 2015-12-07 NOTE — Progress Notes (Signed)
Orthopedic Tech Progress Note Patient Details:  [name] 1990-09-19 [id number]  Ortho Devices Type of Ortho Device: CAM walker Ortho Device/Splint Location: lle Ortho Device/Splint Interventions: Application   Jolonda Gomm 12/07/2015, 8:40 AM
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
## 2015-12-08 ENCOUNTER — Encounter (HOSPITAL_COMMUNITY): Payer: Self-pay | Admitting: *Deleted

## 2015-12-08 ENCOUNTER — Encounter: Payer: Self-pay | Admitting: Orthopedic Surgery

## 2015-12-08 MED ORDER — TRAMADOL HCL 50 MG PO TABS: 100.0000 mg | ORAL_TABLET | Freq: Four times a day (QID) | ORAL | 0 refills | Status: AC

## 2015-12-08 MED ORDER — OXYCODONE-ACETAMINOPHEN 7.5-325 MG PO TABS
1.0000 | ORAL_TABLET | ORAL | 0 refills | Status: AC | PRN
Start: 2015-12-08 — End: ?
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
## 2015-12-08 NOTE — Progress Notes (Signed)
OT Cancellation Note  Patient Details Name: Tami Everett MRN: [id number] DOB: December 31, 1990   Cancelled Treatment:    Reason Eval/Treat Not Completed: Other (comment).. In to see pt for family ed, pt reports family will not be here until probably 1:00pm, I will check back then. I did leave my number with the RN and NT to page me if they notice the family here before then.   Evette GeorgesLeonard, [place] Eva [phone] 12/08/2015, 8:10 AM

## 2015-12-08 NOTE — Progress Notes (Signed)
Patient discharged to home, discharge instructions given, patient stated she understood, equipment at bedside
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
## 2015-12-08 NOTE — Care Management Note (Signed)
Case Management Note  Patient Details  Name: Tami Everett MRN: [id number] Date of Birth: 1990-04-21  Subjective/Objective: Pt medically stable for discharge today.  She will dc home with sister/cousin to assist.  Cousin to stay with her from 8a-2p while her sister works; states has other family to assist her as well.  PT/OT recommending home therapies, but unfortunately pt does not have qualifying diagnosis for home therapies with Medicaid as payor.                   Action/Plan: Pt understands she will not be receiving home therapies.  [facility] for DME needs.  DME to be delivered to pt's room prior to dc.    Expected Discharge Date:    12/08/15              Expected Discharge Plan:  Home w Home Health Services  In-House Referral:     Discharge planning Services  CM Consult  Post Acute Care Choice:    Choice offered to:     DME Arranged:  3-N-1, Tub bench, Wheelchair manual, Scientific laboratory technicianWalker platform DME Agency:  [facility]  HH Arranged:  NA HH Agency:     Status of Service:  Completed, signed off  If discussed at [employer] Meetings, dates discussed:    Additional Comments:  Quintella BatonJulie W. Axtyn Woehler, RN, BSN  Trauma/Neuro ICU Case Manager [phone]
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
## 2018-01-15 ENCOUNTER — Emergency Department (HOSPITAL_COMMUNITY)
Admission: EM | Admit: 2018-01-15 | Discharge: 2018-01-15 | Disposition: A | Discharge status: Home / Self Care | Payer: Self-pay | Attending: Emergency Medicine | Admitting: Emergency Medicine

## 2018-01-15 ENCOUNTER — Encounter (HOSPITAL_COMMUNITY): Payer: Self-pay

## 2018-01-15 ENCOUNTER — Emergency Department (HOSPITAL_COMMUNITY): Payer: Self-pay

## 2018-01-15 DIAGNOSIS — M25571 Pain in right ankle and joints of right foot: Secondary | ICD-10-CM | POA: Insufficient documentation

## 2018-01-15 DIAGNOSIS — W502XXA Accidental twist by another person, initial encounter: Secondary | ICD-10-CM | POA: Insufficient documentation

## 2018-01-15 DIAGNOSIS — F17210 Nicotine dependence, cigarettes, uncomplicated: Secondary | ICD-10-CM | POA: Insufficient documentation

## 2018-01-15 DIAGNOSIS — M79671 Pain in right foot: Secondary | ICD-10-CM | POA: Insufficient documentation

## 2018-01-15 DIAGNOSIS — R9389 Abnormal findings on diagnostic imaging of other specified body structures: Secondary | ICD-10-CM | POA: Insufficient documentation

## 2018-01-15 MED ORDER — HYDROCODONE-ACETAMINOPHEN 5-325 MG PO TABS
1.0000 | ORAL_TABLET | Freq: Once | ORAL | Status: AC
  Administered 2018-01-15: 1 via ORAL
  Filled 2018-01-15: qty 1

## 2018-01-15 MED ORDER — NAPROXEN 500 MG PO TABS: 500.0000 mg | ORAL_TABLET | Freq: Two times a day (BID) | ORAL | 0 refills | Status: AC

## 2018-01-15 NOTE — Discharge Instructions (Addendum)
Please read and follow all provided instructions.  You have been seen today for right foot and ankle pain  Tests performed today include: An x-ray of the affected area - This showed changes over the medial aspect of the base of the first distal phalanx which may be due to subacute fracture/broken bone. You are not tender over this area, however we will treat with a cam walker boot and follow up with orthopedics. Please see attached handout.   There is also changes involving the navicular bone which could be due to osteonecrosis. Osteonecrosis is also known as death of bone tissue. I would like you to call your orthopedist that performed you surgery 2 years ago to discuss these findings and follow up with them in the next 1 week. Please call today  Vital signs. See below for your results today.   Home care instructions: -- *PRICE in the first 24-48 hours after injury: Protect (with brace, splint, sling), if given by your provider - cam walker Rest Ice- Do not apply ice pack directly to your skin, place towel or similar between your skin and ice/ice pack. Apply ice for 20 min, then remove for 40 min while awake Compression- Wear brace, elastic bandage, splint as directed by your provider Elevate affected extremity above the level of your heart when not walking around for the first 24-48 hours   Use Naproxen with food as directed. Do not take with other NSAIDs like ibuprofen. Please do not take if you think there is a chance you could be pregnant as this can lead to birth defects.   Follow-up instructions: Please call your orthopedist today to schedule a follow up appointment for within the next 1 week.   Return instructions:  Please return if your toes or feet are numb or tingling, appear gray or blue, or you have severe pain (also elevate the leg and loosen splint or wrap if you were given one) You develop fever Please return to the Emergency Department if you experience worsening symptoms.   Please return if you have any other emergent concerns. Additional Information:  Your vital signs today were: BP 129/90 (BP Location: Right Arm)    Pulse 72    Temp 98.1 F (36.7 C) (Oral)    Resp 16    Ht 5\' 3"  (1.6 m)    Wt 81.6 kg    LMP 11/29/2017    SpO2 100%    BMI 31.89 kg/m  If your blood pressure (BP) was elevated above 135/85 this visit, please have this repeated by your doctor within one month. ---------------

## 2018-01-15 NOTE — ED Triage Notes (Signed)
Patient c/o right foot and right ankle pain since last night. Patient states she felt her right foot and ankle "Popping".
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
## 2018-04-04 ENCOUNTER — Encounter (HOSPITAL_COMMUNITY): Payer: Self-pay | Admitting: *Deleted

## 2018-04-04 ENCOUNTER — Emergency Department (HOSPITAL_COMMUNITY)
Admission: EM | Admit: 2018-04-04 | Discharge: 2018-04-04 | Disposition: A | Discharge status: Home / Self Care | Payer: Self-pay | Attending: Emergency Medicine | Admitting: Emergency Medicine

## 2018-04-04 DIAGNOSIS — A599 Trichomoniasis, unspecified: Secondary | ICD-10-CM | POA: Insufficient documentation

## 2018-04-04 DIAGNOSIS — Z79899 Other long term (current) drug therapy: Secondary | ICD-10-CM | POA: Insufficient documentation

## 2018-04-04 DIAGNOSIS — F17210 Nicotine dependence, cigarettes, uncomplicated: Secondary | ICD-10-CM | POA: Insufficient documentation

## 2018-04-04 LAB — WET PREP, GENITAL
Sperm: NONE SEEN
Yeast Wet Prep HPF POC: NONE SEEN

## 2018-04-04 LAB — POC URINE PREG, ED: Preg Test, Ur: NEGATIVE

## 2018-04-04 MED ORDER — CEFTRIAXONE SODIUM 250 MG IJ SOLR
250.0000 mg | Freq: Once | INTRAMUSCULAR | Status: AC
  Administered 2018-04-04: 250 mg via INTRAMUSCULAR
  Filled 2018-04-04: qty 250

## 2018-04-04 MED ORDER — AZITHROMYCIN 250 MG PO TABS
1000.0000 mg | ORAL_TABLET | Freq: Once | ORAL | Status: AC
  Administered 2018-04-04: 1000 mg via ORAL
  Filled 2018-04-04: qty 4

## 2018-04-04 MED ORDER — STERILE WATER FOR INJECTION IJ SOLN
INTRAMUSCULAR | Status: AC
  Administered 2018-04-04: 10 mL
  Filled 2018-04-04: qty 10

## 2018-04-04 MED ORDER — METRONIDAZOLE 500 MG PO TABS: 500.0000 mg | ORAL_TABLET | Freq: Two times a day (BID) | ORAL | 0 refills | Status: DC

## 2018-04-04 NOTE — Discharge Instructions (Signed)
Please read attached information. If you experience any new or worsening signs or symptoms please return to the emergency room for evaluation. Please follow-up with your primary care provider or specialist as discussed. Please use medication prescribed only as directed and discontinue taking if you have any concerning signs or symptoms.   °

## 2018-04-04 NOTE — ED Notes (Signed)
Patient verbalizes understanding of discharge instructions. Opportunity for questioning and answers were provided. Armband removed by staff, pt discharged from ED.  

## 2018-04-04 NOTE — ED Triage Notes (Signed)
Pt in c/o excessive vaginal bleeding that started during intercourse, reports that she was already on her normal cycle and heard a pop sound and bleeding increased significantly, reports suprapubic cramping as well
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
## 2018-04-05 LAB — GC/CHLAMYDIA PROBE AMP (~~LOC~~) NOT AT ARMC
Chlamydia: NEGATIVE
Neisseria Gonorrhea: NEGATIVE

## 2019-06-13 ENCOUNTER — Ambulatory Visit (INDEPENDENT_AMBULATORY_CARE_PROVIDER_SITE_OTHER): Payer: Self-pay

## 2019-06-13 ENCOUNTER — Ambulatory Visit (HOSPITAL_COMMUNITY)
Admission: EM | Admit: 2019-06-13 | Discharge: 2019-06-13 | Disposition: A | Discharge status: Home / Self Care | Payer: Self-pay | Attending: Family Medicine | Admitting: Family Medicine

## 2019-06-13 ENCOUNTER — Encounter (HOSPITAL_COMMUNITY): Payer: Self-pay

## 2019-06-13 DIAGNOSIS — M79671 Pain in right foot: Secondary | ICD-10-CM

## 2019-06-13 DIAGNOSIS — M79672 Pain in left foot: Secondary | ICD-10-CM

## 2019-06-13 NOTE — ED Provider Notes (Signed)
[facility]   [id number] 06/13/19 Arrival Time: 1117  ASSESSMENT & PLAN:  1. Foot pain, right   2. Foot pain, left     I have personally viewed the imaging studies ordered this visit. See radiology report as following:  DG Foot Complete Left  Result Date: 06/13/2019 CLINICAL DATA:  Pain EXAM: LEFT FOOT - COMPLETE 3+ VIEW COMPARISON:  December 05, 2015 FINDINGS: Frontal, oblique, and lateral views were obtained. There is evidence of prior fracture of the lateral malleolus with healing response in this area. A small calcification is noted lateral to the ankle joint, likely residua from prior trauma in this region. No acute fracture or dislocation. There is mild spurring in the dorsal midfoot. Other joint spaces appear normal. No erosive change. IMPRESSION: Evidence of prior fracture of the lateral malleolus with healing. Small bony focus posteriorly in the ankle region, likely residua of prior trauma. No acute fracture or dislocation. No appreciable joint space narrowing. Mild spurring dorsal midfoot. Electronically Signed   By: Lowella Grip III M.D.   On: 06/13/2019 12:51   DG Foot Complete Right  Result Date: 06/13/2019 CLINICAL DATA:  Right foot pain EXAM: RIGHT FOOT COMPLETE - 3+ VIEW COMPARISON:  01/15/2018 FINDINGS: Abnormally sclerotic appearance of the navicular with progressive fragmentation suspicious for osteonecrosis. Degenerative changes within the midfoot with dorsal hypertrophy. Mild-moderate arthropathy at the first MTP joint and IP joints of the great toe. There is soft tissue swelling over the medial aspect at the level of the midfoot. IMPRESSION: 1. Progressive sclerosis and fragmentation of the navicular suspicious for osteonecrosis. 2. Soft tissue swelling over the medial aspect of the midfoot. 3. Mild-moderate degenerative changes of the great toe and first MTP joint. Electronically Signed   By: Davina Poke D.O.   On: 06/13/2019 12:43    Discussed  findings with her. Placed in CAM walker for comfort. Tylenol/Advil as needed. Work note provided.  Recommend: Follow-up Information    Schedule an appointment as soon as possible for a visit  with Haddix, Thomasene Lot, MD.   Specialty: Orthopedic Surgery Contact information: [place] [address] [phone]            Reviewed expectations re: course of current medical issues. Questions answered. Outlined signs and symptoms indicating need for more acute intervention. Patient verbalized understanding. After Visit Summary given.  SUBJECTIVE: History from: patient. Tami Everett is a 29 y.o. female who reports vague pains of both of her feet; unsure how long but definitely has noticed more pain in R foot over the past couple of weeks. Able to bear weight; more pain after working. Questions occasional swelling. No extremity sensation changes or weakness. No injury/trauma reported. Symptoms have waxed and waned since beginning. Aggravating factors: prolonged walking/standing. Alleviating factors: rest. Associated symptoms: none reported.    Past Surgical History:  Procedure Laterality Date  . CESAREAN SECTION    . CESAREAN SECTION N/A 08/11/2014   Procedure: CESAREAN SECTION;  Surgeon: Woodroe Mode, MD;  Location: [place] [facility];  Service: Obstetrics;  Laterality: N/A;  . CLOSED REDUCTION TIBIA  12/03/2015   Procedure: [place] ;  Surgeon: Wylene Simmer, MD;  Location: [place];  Service: Orthopedics;;  . DILATION AND CURETTAGE OF UTERUS    . OPEN REDUCTION INTERNAL FIXATION (ORIF) DISTAL RADIAL FRACTURE Right 11/29/2015   Procedure: OPEN REDUCTION INTERNAL FIXATION (ORIF) DISTAL RADIAL FRACTURE;  Surgeon: Iran Planas, MD;  Location: [place];  Service: Orthopedics;  Laterality:  Right;  Marland Kitchen ORIF ANKLE FRACTURE Right 12/03/2015   Procedure: OPEN REDUCTION INTERNAL FIXATION (ORIF) RIGHT NAVICULAR POSSIBLE APPLICATION EXTERNAL FIXATOR;  Surgeon: Toni Arthurs,  MD;  Location: [facility];  Service: Orthopedics;  Laterality: Right;      OBJECTIVE:  Vitals:   06/13/19 1148  BP: 126/81  Pulse: 80  Resp: 18  Temp: 98.8 F (37.1 C)  TempSrc: Oral  SpO2: 100%    General appearance: alert; no distress HEENT: [place]; AT Neck: supple with FROM Resp: unlabored respirations Extremities: . LLE: warm with well perfused appearance; poorly localized mild tenderness over left dorsal foot; without gross deformities; swelling: none; bruising: none; ankle ROM: normal  . RLE: warm with well perfused appearance; poorly localized moderate tenderness over right proximal dorsal foot; without gross deformities; swelling: none; bruising: none; ankle ROM: normal CV: normal extremity capillary refill of bilateral LE; 1+ DP pulse of bilateral LE. Skin: warm and dry; no visible rashes Neurologic: gait normal; normal sensation and strength of bilateral LE Psychological: alert and cooperative; normal mood and affect  Imaging: DG Foot Complete Left  Result Date: 06/13/2019 CLINICAL DATA:  Pain EXAM: LEFT FOOT - COMPLETE 3+ VIEW COMPARISON:  December 05, 2015 FINDINGS: Frontal, oblique, and lateral views were obtained. There is evidence of prior fracture of the lateral malleolus with healing response in this area. A small calcification is noted lateral to the ankle joint, likely residua from prior trauma in this region. No acute fracture or dislocation. There is mild spurring in the dorsal midfoot. Other joint spaces appear normal. No erosive change. IMPRESSION: Evidence of prior fracture of the lateral malleolus with healing. Small bony focus posteriorly in the ankle region, likely residua of prior trauma. No acute fracture or dislocation. No appreciable joint space narrowing. Mild spurring dorsal midfoot. Electronically Signed   By: Bretta Bang III M.D.   On: 06/13/2019 12:51   DG Foot Complete Right  Result Date: 06/13/2019 CLINICAL DATA:  Right foot pain EXAM: RIGHT FOOT  COMPLETE - 3+ VIEW COMPARISON:  01/15/2018 FINDINGS: Abnormally sclerotic appearance of the navicular with progressive fragmentation suspicious for osteonecrosis. Degenerative changes within the midfoot with dorsal hypertrophy. Mild-moderate arthropathy at the first MTP joint and IP joints of the great toe. There is soft tissue swelling over the medial aspect at the level of the midfoot. IMPRESSION: 1. Progressive sclerosis and fragmentation of the navicular suspicious for osteonecrosis. 2. Soft tissue swelling over the medial aspect of the midfoot. 3. Mild-moderate degenerative changes of the great toe and first MTP joint. Electronically Signed   By: Duanne Guess D.O.   On: 06/13/2019 12:43      No Known Allergies  Past Medical History:  Diagnosis Date  . Medical history non-contributory    Social History   Socioeconomic History  . Marital status: Single    Spouse name: Not on file  . Number of children: Not on file  . Years of education: Not on file  . Highest education level: Not on file  Occupational History  . Not on file  Tobacco Use  . Smoking status: Current Every Day Smoker    Packs/day: 0.15    Types: Cigarettes  . Smokeless tobacco: Never Used  Substance and Sexual Activity  . Alcohol use: Yes    Comment: weekend  . Drug use: Not Currently    Types: Marijuana    Comment: quit when found out pregnant 11/2013  . Sexual activity: Yes  Other Topics Concern  . Not on file  Social History Narrative   ** Merged History Encounter **       Social Determinants of Health   Financial Resource Strain:   . Difficulty of Paying Living Expenses:   Food Insecurity:   . Worried About Programme researcher, broadcasting/film/video in the Last Year:   . Barista in the Last Year:   Transportation Needs:   . Freight forwarder (Medical):   Marland Kitchen Lack of Transportation (Non-Medical):   Physical Activity:   . Days of Exercise per Week:   . Minutes of Exercise per Session:   Stress:   .  Feeling of Stress :   Social Connections:   . Frequency of Communication with Friends and Family:   . Frequency of Social Gatherings with Friends and Family:   . Attends Religious Services:   . Active Member of Clubs or Organizations:   . Attends Banker Meetings:   Marland Kitchen Marital Status:    Family History  Problem Relation Age of Onset  . Hypertension Father   . COPD Father   . Heart disease Brother    Past Surgical History:  Procedure Laterality Date  . CESAREAN SECTION    . CESAREAN SECTION N/A 08/11/2014   Procedure: CESAREAN SECTION;  Surgeon: Adam Phenix, MD;  Location: [facility];  Service: Obstetrics;  Laterality: N/A;  . CLOSED REDUCTION TIBIA  12/03/2015   Procedure: CLOSED REDUCTION TIBIA Plateau ;  Surgeon: Toni Arthurs, MD;  Location: [facility];  Service: Orthopedics;;  . DILATION AND CURETTAGE OF UTERUS    . OPEN REDUCTION INTERNAL FIXATION (ORIF) DISTAL RADIAL FRACTURE Right 11/29/2015   Procedure: OPEN REDUCTION INTERNAL FIXATION (ORIF) DISTAL RADIAL FRACTURE;  Surgeon: Bradly Bienenstock, MD;  Location: [facility];  Service: Orthopedics;  Laterality: Right;  . ORIF ANKLE FRACTURE Right 12/03/2015   Procedure: OPEN REDUCTION INTERNAL FIXATION (ORIF) RIGHT NAVICULAR POSSIBLE APPLICATION EXTERNAL FIXATOR;  Surgeon: Toni Arthurs, MD;  Location: [facility];  Service: Orthopedics;  Laterality: Right;      Mardella Layman, MD 06/13/19 1321

## 2019-06-13 NOTE — Discharge Instructions (Addendum)
If not allergic, you may use over the counter ibuprofen or acetaminophen as needed. ° °
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
## 2019-08-11 ENCOUNTER — Encounter (HOSPITAL_COMMUNITY): Payer: Self-pay | Admitting: Emergency Medicine

## 2019-08-11 ENCOUNTER — Other Ambulatory Visit: Payer: Self-pay

## 2019-08-11 ENCOUNTER — Emergency Department (HOSPITAL_COMMUNITY): Payer: Self-pay

## 2019-08-11 ENCOUNTER — Emergency Department (HOSPITAL_COMMUNITY)
Admission: EM | Admit: 2019-08-11 | Discharge: 2019-08-11 | Disposition: A | Discharge status: Home / Self Care | Payer: Self-pay | Attending: Emergency Medicine | Admitting: Emergency Medicine

## 2019-08-11 DIAGNOSIS — Y92009 Unspecified place in unspecified non-institutional (private) residence as the place of occurrence of the external cause: Secondary | ICD-10-CM | POA: Insufficient documentation

## 2019-08-11 DIAGNOSIS — F17210 Nicotine dependence, cigarettes, uncomplicated: Secondary | ICD-10-CM | POA: Insufficient documentation

## 2019-08-11 DIAGNOSIS — Y9369 Activity, other involving other sports and athletics played as a team or group: Secondary | ICD-10-CM | POA: Insufficient documentation

## 2019-08-11 DIAGNOSIS — F1290 Cannabis use, unspecified, uncomplicated: Secondary | ICD-10-CM | POA: Insufficient documentation

## 2019-08-11 DIAGNOSIS — Y999 Unspecified external cause status: Secondary | ICD-10-CM | POA: Insufficient documentation

## 2019-08-11 DIAGNOSIS — S8261XA Displaced fracture of lateral malleolus of right fibula, initial encounter for closed fracture: Secondary | ICD-10-CM | POA: Insufficient documentation

## 2019-08-11 DIAGNOSIS — S82891A Other fracture of right lower leg, initial encounter for closed fracture: Secondary | ICD-10-CM

## 2019-08-11 DIAGNOSIS — R52 Pain, unspecified: Secondary | ICD-10-CM

## 2019-08-11 DIAGNOSIS — W010XXA Fall on same level from slipping, tripping and stumbling without subsequent striking against object, initial encounter: Secondary | ICD-10-CM | POA: Insufficient documentation

## 2019-08-11 NOTE — Discharge Instructions (Signed)
You have been seen here for an ankle fracture.  I have placed in a boot given you crutches please try to keep your ankle immobilized.  I recommend that you apply ice to the area and keep your foot elevated as this will help with inflammation and swelling.  Do not place ice on bare skin as this can cause a burn.  You may alternate been taking ibuprofen and Tylenol every 6 hours for pain.  For example you can take Tylenol first wait 6 hours then take ibuprofen wait another 6 hours and repeat.  I have given you information to follow-up with orthopedics please call tomorrow to schedule an appointment.  I want you to come back to the emergency department if you develop discoloration of your foot, numbness and tingling in your feet, increased swelling or worsening pain, shortness of breath, chest pain fever as the symptoms got a further evaluation.

## 2019-08-11 NOTE — ED Notes (Signed)
Patient refused d/c vital signs. 
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
## 2019-08-11 NOTE — ED Notes (Signed)
Patient refusing cam walker and crutches. PA made aware.

## 2019-08-11 NOTE — ED Triage Notes (Signed)
Per pt, states she rolled her right ankle yesterday while playing with daughter-right ankle swollen

## 2019-08-11 NOTE — ED Notes (Signed)
Ortho tech called to apply boot and crutches. 

## 2019-11-14 ENCOUNTER — Other Ambulatory Visit: Payer: Self-pay

## 2019-11-14 ENCOUNTER — Ambulatory Visit (HOSPITAL_COMMUNITY)
Admission: EM | Admit: 2019-11-14 | Discharge: 2019-11-14 | Discharge status: Against Medical Advice | Payer: Medicaid Other | Attending: Family Medicine | Admitting: Family Medicine

## 2019-11-15 ENCOUNTER — Other Ambulatory Visit: Payer: Self-pay

## 2019-11-15 ENCOUNTER — Ambulatory Visit (HOSPITAL_COMMUNITY)
Admission: EM | Admit: 2019-11-15 | Discharge: 2019-11-15 | Disposition: A | Discharge status: Home / Self Care | Payer: Medicaid Other | Attending: Family Medicine | Admitting: Family Medicine

## 2019-11-15 ENCOUNTER — Encounter (HOSPITAL_COMMUNITY): Payer: Self-pay

## 2019-11-15 DIAGNOSIS — Z3202 Encounter for pregnancy test, result negative: Secondary | ICD-10-CM

## 2019-11-15 DIAGNOSIS — R350 Frequency of micturition: Secondary | ICD-10-CM

## 2019-11-15 DIAGNOSIS — N898 Other specified noninflammatory disorders of vagina: Secondary | ICD-10-CM | POA: Diagnosis not present

## 2019-11-15 DIAGNOSIS — M549 Dorsalgia, unspecified: Secondary | ICD-10-CM | POA: Diagnosis not present

## 2019-11-15 DIAGNOSIS — R519 Headache, unspecified: Secondary | ICD-10-CM

## 2019-11-15 DIAGNOSIS — N760 Acute vaginitis: Secondary | ICD-10-CM

## 2019-11-15 LAB — POCT URINALYSIS DIPSTICK, ED / UC
Bilirubin Urine: NEGATIVE
Glucose, UA: NEGATIVE mg/dL
Hgb urine dipstick: NEGATIVE
Ketones, ur: NEGATIVE mg/dL
Nitrite: NEGATIVE
Protein, ur: NEGATIVE mg/dL
Specific Gravity, Urine: 1.02 (ref 1.005–1.030)
Urobilinogen, UA: 0.2 mg/dL (ref 0.0–1.0)
pH: 8.5 — ABNORMAL HIGH (ref 5.0–8.0)

## 2019-11-15 LAB — POC URINE PREG, ED: Preg Test, Ur: NEGATIVE

## 2019-11-15 MED ORDER — CEFTRIAXONE SODIUM 500 MG IJ SOLR
INTRAMUSCULAR | Status: AC
  Filled 2019-11-15: qty 500

## 2019-11-15 MED ORDER — DOXYCYCLINE HYCLATE 100 MG PO CAPS: 100.0000 mg | ORAL_CAPSULE | Freq: Two times a day (BID) | ORAL | 0 refills | Status: AC

## 2019-11-15 MED ORDER — LIDOCAINE HCL (PF) 1 % IJ SOLN
INTRAMUSCULAR | Status: AC
  Filled 2019-11-15: qty 2

## 2019-11-15 MED ORDER — CEFTRIAXONE SODIUM 500 MG IJ SOLR
500.0000 mg | Freq: Once | INTRAMUSCULAR | Status: AC
  Administered 2019-11-15: 500 mg via INTRAMUSCULAR

## 2019-11-15 NOTE — ED Provider Notes (Signed)
[facility]   [id number] 11/15/19 Arrival Time: 1348  ASSESSMENT & PLAN:  1. Vaginal discharge   2. Urinary frequency       Discharge Instructions     You have been given the following today for treatment of suspected gonorrhea and/or chlamydia:  cefTRIAXone (ROCEPHIN) injection 500 mg  Please pick up your prescription for doxycycline 100 mg and begin taking twice daily for the next seven (7) days.  Even though we have treated you today, we have sent testing for sexually transmitted infections. We will notify you of any positive results once they are received. If required, we will prescribe any medications you might need.  Please refrain from all sexual activity for at least the next seven days.     Without s/s of PID. Large leuks on urine. Culture sent.  Labs Reviewed  POCT URINALYSIS DIPSTICK, ED / UC - Abnormal; Notable for the following components:      Result Value   pH 8.5 (*)    Leukocytes,Ua LARGE (*)    All other components within normal limits  URINE CULTURE  POC URINE PREG, ED  CERVICOVAGINAL ANCILLARY ONLY    Will notify of any positive results. Instructed to refrain from sexual activity for at least seven days.  Reviewed expectations re: course of current medical issues. Questions answered. Outlined signs and symptoms indicating need for more acute intervention. Patient verbalized understanding. After Visit Summary given.   SUBJECTIVE:  Tami Everett is a 29 y.o. female who presents with complaint of vaginal discharge/irritation. Onset gradual. First noticed a week ago. No specific aggravating or alleviating factors reported. Denies: dysuria and gross hematuria. Does reports mild urinary frequency. Afebrile. No abdominal or pelvic pain. Mild low back discomfort at times. Normal PO intake wihout n/v. No genital rashes or lesions. Reports that she is sexually active with single female partner.  Patient's last menstrual period was  10/20/2019.   OBJECTIVE:  Vitals:   11/15/19 1625  BP: 122/85  Pulse: 85  Resp: 18  Temp: 98.6 F (37 C)  TempSrc: Oral  SpO2: 100%     General appearance: alert, cooperative, appears stated age and no distress Lungs: unlabored respirations; speaks full sentences without difficulty Back: no CVA tenderness; FROM at waist Abdomen: soft GU: deferred Skin: warm and dry Psychological: alert and cooperative; normal mood and affect.  Results for orders placed or performed during the hospital encounter of 11/15/19  POC Urinalysis dipstick  Result Value Ref Range   Glucose, UA NEGATIVE NEGATIVE mg/dL   Bilirubin Urine NEGATIVE NEGATIVE   Ketones, ur NEGATIVE NEGATIVE mg/dL   Specific Gravity, Urine 1.020 1.005 - 1.030   Hgb urine dipstick NEGATIVE NEGATIVE   pH 8.5 (H) 5.0 - 8.0   Protein, ur NEGATIVE NEGATIVE mg/dL   Urobilinogen, UA 0.2 0.0 - 1.0 mg/dL   Nitrite NEGATIVE NEGATIVE   Leukocytes,Ua LARGE (A) NEGATIVE  POC urine pregnancy  Result Value Ref Range   Preg Test, Ur NEGATIVE NEGATIVE      No Known Allergies  Past Medical History:  Diagnosis Date  . Medical history non-contributory    Family History  Problem Relation Age of Onset  . Hypertension Father   . COPD Father   . Heart disease Brother    Social History   Socioeconomic History  . Marital status: Single    Spouse name: Not on file  . Number of children: Not on file  . Years of education: Not on file  . Highest  education level: Not on file  Occupational History  . Not on file  Tobacco Use  . Smoking status: Current Every Day Smoker    Packs/day: 0.15    Types: Cigarettes  . Smokeless tobacco: Never Used  Vaping Use  . Vaping Use: Never used  Substance and Sexual Activity  . Alcohol use: Yes    Comment: weekend  . Drug use: Not Currently    Types: Marijuana    Comment: quit when found out pregnant 11/2013  . Sexual activity: Yes  Other Topics Concern  . Not on file  Social History  Narrative   ** Merged History Encounter **       Social Determinants of Health   Financial Resource Strain:   . Difficulty of Paying Living Expenses: Not on file  Food Insecurity:   . Worried About Programme researcher, broadcasting/film/video in the Last Year: Not on file  . Ran Out of Food in the Last Year: Not on file  Transportation Needs:   . Lack of Transportation (Medical): Not on file  . Lack of Transportation (Non-Medical): Not on file  Physical Activity:   . Days of Exercise per Week: Not on file  . Minutes of Exercise per Session: Not on file  Stress:   . Feeling of Stress : Not on file  Social Connections:   . Frequency of Communication with Friends and Family: Not on file  . Frequency of Social Gatherings with Friends and Family: Not on file  . Attends Religious Services: Not on file  . Active Member of Clubs or Organizations: Not on file  . Attends Banker Meetings: Not on file  . Marital Status: Not on file  Intimate Partner Violence:   . Fear of Current or Ex-Partner: Not on file  . Emotionally Abused: Not on file  . Physically Abused: Not on file  . Sexually Abused: Not on file          Mardella Layman, MD 11/15/19 1719

## 2019-11-15 NOTE — Discharge Instructions (Addendum)

## 2019-11-15 NOTE — ED Triage Notes (Signed)
Pt presents for STD testing after having some vaginal irritation X 1 week; pt also has lower back pain & frequent urination along with intermittent headache.

## 2019-11-16 LAB — URINE CULTURE: Culture: NO GROWTH

## 2019-11-18 ENCOUNTER — Telehealth (HOSPITAL_COMMUNITY): Payer: Self-pay | Admitting: Emergency Medicine

## 2019-11-18 LAB — CERVICOVAGINAL ANCILLARY ONLY
Bacterial Vaginitis (gardnerella): POSITIVE — AB
Candida Glabrata: NEGATIVE
Candida Vaginitis: NEGATIVE
Chlamydia: NEGATIVE
Comment: NEGATIVE
Comment: NEGATIVE
Comment: NEGATIVE
Comment: NEGATIVE
Comment: NEGATIVE
Comment: NORMAL
Neisseria Gonorrhea: NEGATIVE
Trichomonas: NEGATIVE

## 2019-11-18 MED ORDER — METRONIDAZOLE 500 MG PO TABS: 500.0000 mg | ORAL_TABLET | Freq: Two times a day (BID) | ORAL | 0 refills | Status: AC

## 2022-06-26 ENCOUNTER — Emergency Department (HOSPITAL_COMMUNITY): Payer: Medicaid Other

## 2022-06-26 ENCOUNTER — Other Ambulatory Visit: Payer: Self-pay

## 2022-06-26 ENCOUNTER — Emergency Department (HOSPITAL_COMMUNITY)
Admission: EM | Admit: 2022-06-26 | Discharge: 2022-06-26 | Disposition: A | Discharge status: Home / Self Care | Payer: Medicaid Other | Attending: Emergency Medicine | Admitting: Emergency Medicine

## 2022-06-26 DIAGNOSIS — M79671 Pain in right foot: Secondary | ICD-10-CM | POA: Insufficient documentation

## 2022-06-26 DIAGNOSIS — F17200 Nicotine dependence, unspecified, uncomplicated: Secondary | ICD-10-CM | POA: Insufficient documentation

## 2022-06-26 MED ORDER — IBUPROFEN 600 MG PO TABS: 600.0000 mg | ORAL_TABLET | Freq: Four times a day (QID) | ORAL | 0 refills | Status: AC | PRN

## 2022-06-26 MED ORDER — DICLOFENAC SODIUM 1 % EX GEL: 2.0000 g | Freq: Four times a day (QID) | CUTANEOUS | 0 refills | Status: AC

## 2022-06-26 MED ORDER — CYCLOBENZAPRINE HCL 10 MG PO TABS: 10.0000 mg | ORAL_TABLET | Freq: Two times a day (BID) | ORAL | 0 refills | Status: AC | PRN
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
## 2022-06-26 NOTE — ED Provider Notes (Signed)
[facility] EMERGENCY DEPARTMENT AT [facility] Provider Note   CSN: [id number] Arrival date & time: 06/26/22  4098     History  Chief Complaint  Patient presents with   Foot Pain    Tami Everett is a 32 y.o. female.  The history is provided by the patient and medical records. No language interpreter was used.  Foot Pain     32 year old female significant history of polysubstance abuse presenting for evaluation of foot pain.  Patient states in 2019 she has had significant headaches involving the broken bone which includes broken bones in her right foot requiring surgical repair.  She has had some foot pain since for the past 2 to 3 days she noticed progressively worsening pain about her right foot.  She described this as a throbbing sensation worse with prolonged standing.  She works in [employer] requiring prolonged standing which aggravates her symptoms.  She tries to rest and elevate your foot without adequate relief.  States she is on her feet all day long.  She does not complain of any calf tenderness no chest pain or shortness of breath no prior history of PE or DVT and denies any recent injury.  No recent change in her footwear.  Home Medications Prior to Admission medications   Medication Sig Start Date End Date Taking? Authorizing Provider  doxycycline (VIBRAMYCIN) 100 MG capsule Take 1 capsule (100 mg total) by mouth 2 (two) times daily. 11/15/19   Mardella Layman, MD  metroNIDAZOLE (FLAGYL) 500 MG tablet Take 1 tablet (500 mg total) by mouth 2 (two) times daily. 11/18/19   Lamptey, Britta Mccreedy, MD  naproxen (NAPROSYN) 500 MG tablet Take 1 tablet (500 mg total) by mouth 2 (two) times daily. 01/15/18   Maczis, Elmer Sow, PA-C  oxyCODONE-acetaminophen (PERCOCET) 7.5-325 MG tablet Take 1-2 tablets by mouth every 4 (four) hours as needed. 12/08/15   Freeman Caldron, PA-C  traMADol (ULTRAM) 50 MG tablet Take 2 tablets (100 mg total) by mouth every 6 (six) hours. 12/08/15    Freeman Caldron, PA-C      Allergies    Patient has no known allergies.    Review of Systems   Review of Systems  All other systems reviewed and are negative.   Physical Exam Updated Vital Signs BP (!) 134/94   Pulse 84   Temp 98.4 F (36.9 C) (Oral)   Resp 18   Ht 5\' 3"  (1.6 m)   Wt 90.7 kg   LMP 06/14/2022   SpO2 97%   BMI 35.43 kg/m  Physical Exam Vitals and nursing note reviewed.  Constitutional:      General: She is not in acute distress.    Appearance: She is well-developed. She is obese.  HENT:     Head: Atraumatic.  Eyes:     Conjunctiva/sclera: Conjunctivae normal.  Pulmonary:     Effort: Pulmonary effort is normal.  Musculoskeletal:        General: Tenderness (Right foot: Tenderness to midfoot on palpation without any overlying skin changes.  Dorsalis pedis pulse with brisk cap refill.  No deformity.  FROM  right ankle.) present.     Cervical back: Neck supple.  Skin:    Findings: No rash.  Neurological:     Mental Status: She is alert.  Psychiatric:        Mood and Affect: Mood normal.     ED Results / Procedures / Treatments   Labs (all labs ordered are listed,  but only abnormal results are displayed) Labs Reviewed - No data to display  EKG None  Radiology DG Ankle Complete Right  Result Date: 06/26/2022 CLINICAL DATA:  Pain. EXAM: RIGHT ANKLE - COMPLETE 3+ VIEW COMPARISON:  11/28/2015 FINDINGS: No evidence for an acute fracture. No subluxation or dislocation. Degenerative changes are seen in the tibiotalar joint. Cortical irregularity along the medial and lateral malleoli is consistent with remote/repetitive trauma. No worrisome lytic or sclerotic osseous abnormality. IMPRESSION: The chronic degenerative changes without acute bony abnormality. Electronically Signed   By: Kennith Center M.D.   On: 06/26/2022 09:50   DG Foot Complete Right  Result Date: 06/26/2022 CLINICAL DATA:  Foot and ankle pain EXAM: RIGHT FOOT COMPLETE - 3+ VIEW  COMPARISON:  06/13/2019 FINDINGS: No evidence for acute fracture dislocation. As before, the navicular appears sclerotic and irregular, likely secondary to prior trauma. Degenerative changes are associated. There is degenerative spurring in the tibiotalar joint with degenerative changes noted MTP and IP joints of the great toe. IMPRESSION: 1. No acute bony findings. Electronically Signed   By: Kennith Center M.D.   On: 06/26/2022 09:49    Procedures Procedures    Medications Ordered in ED Medications - No data to display  ED Course/ Medical Decision Making/ A&P                             Medical Decision Making Amount and/or Complexity of Data Reviewed Radiology: ordered.   BP (!) 134/94   Pulse 84   Temp 98.4 F (36.9 C) (Oral)   Resp 18   Ht 5\' 3"  (1.6 m)   Wt 90.7 kg   LMP 06/14/2022   SpO2 97%   BMI 35.43 kg/m   27:48 AM  32 year old female significant history of polysubstance abuse presenting for evaluation of foot pain.  Patient states in 2019 she has had significant headaches involving the broken bone which includes broken bones in her right foot requiring surgical repair.  She has had some foot pain since for the past 2 to 3 days she noticed progressively worsening pain about her right foot.  She described this as a throbbing sensation worse with prolonged standing.  She works in [employer] requiring prolonged standing which aggravates her symptoms.  She tries to rest and elevate your foot without adequate relief.  States she is on her feet all day long.  She does not complain of any calf tenderness no chest pain or shortness of breath no prior history of PE or DVT and denies any recent injury.  No recent change in her footwear.  On exam, patient is nontoxic in appearance.  Right foot with tenderness to the midfoot but no signs of infection or any deformity noted.  She is neurovascularly intact.  X-ray of right foot and right ankle obtained. Pain medication offered, pt  declined \ 10:24 AM X-ray of right foot and ankle obtained independently viewed and interpreted by me and I agree with radiologist interpretation.  Fortunately no acute finding noted.  Her pain is likely arthritic pain from prolonged standing.  Differential diagnosis include fracture, dislocation, plantar fasciitis, septic joint, cellulitis, gout, strain, sprain  Discussed RICE therapy.  Will discharge home with Ace wrap, antiinflammatory medication, muscle relaxants, and muscle rub.  Social determinant health including tobacco use.  Doubt DVT.  Ace wrap provided with adequate relief of sxs.         Final Clinical Impression(s) / ED  Diagnoses Final diagnoses:  Foot pain, right    Rx / DC Orders ED Discharge Orders     None         Fayrene Helper, PA-C 06/26/22 1027    Wynetta Fines, MD 06/26/22 1536

## 2022-06-26 NOTE — Discharge Instructions (Addendum)
Your pain is likely arthritis from prolonged standing.  You may wear Ace wrap for compression and support.  Keep foot and leg elevated at rest for comfort.  You may apply Voltaren gel to the sole of the foot up to 4 times a day to aid with pain.  You may take ibuprofen and muscle relaxant as needed for symptom control.

## 2022-10-04 ENCOUNTER — Emergency Department (HOSPITAL_COMMUNITY): Payer: Medicaid Other

## 2022-10-04 ENCOUNTER — Other Ambulatory Visit: Payer: Self-pay

## 2022-10-04 ENCOUNTER — Emergency Department (HOSPITAL_COMMUNITY)
Admission: EM | Admit: 2022-10-04 | Discharge: 2022-10-04 | Disposition: A | Discharge status: Home / Self Care | Payer: Medicaid Other | Attending: Emergency Medicine | Admitting: Emergency Medicine

## 2022-10-04 DIAGNOSIS — M25571 Pain in right ankle and joints of right foot: Secondary | ICD-10-CM | POA: Diagnosis present

## 2022-10-04 DIAGNOSIS — X501XXA Overexertion from prolonged static or awkward postures, initial encounter: Secondary | ICD-10-CM | POA: Diagnosis not present

## 2022-10-04 DIAGNOSIS — S93401A Sprain of unspecified ligament of right ankle, initial encounter: Secondary | ICD-10-CM | POA: Insufficient documentation

## 2022-10-04 DIAGNOSIS — Y93E5 Activity, floor mopping and cleaning: Secondary | ICD-10-CM | POA: Diagnosis not present

## 2022-10-04 NOTE — ED Provider Notes (Signed)
**Tami Everett De-Identified via Obfuscation** Tami Tami Everett   CSN: [id number] Arrival date & time: 10/04/22  1206     History  Chief Complaint  Patient presents with   Foot Pain    Tami Tami Everett is Tami Everett 32 y.o. female who presents emerged department with concerns for right foot injury onset yesterday.  Notes that she was cleaning and twisted her right ankle.  Denies falling.  No meds tried prior to arrival.  Patient notes that she does not like to take medications.  Denies color change.  The history is provided by the patient. No language interpreter was used.       Home Medications Prior to Admission medications   Medication Sig Start Date End Date Taking? Authorizing Provider  cyclobenzaprine (FLEXERIL) 10 MG tablet Take 1 tablet (10 mg total) by mouth 2 (two) times daily as needed for muscle spasms. 06/26/22   Fayrene Helper, Tami Tami Everett  diclofenac Sodium (VOLTAREN) 1 % GEL Apply 2 g topically 4 (four) times daily. 06/26/22   Fayrene Helper, Tami Tami Everett  doxycycline (VIBRAMYCIN) 100 MG capsule Take 1 capsule (100 mg total) by mouth 2 (two) times daily. 11/15/19   Mardella Layman, MD  ibuprofen (ADVIL) 600 MG tablet Take 1 tablet (600 mg total) by mouth every 6 (six) hours as needed. 06/26/22   Fayrene Helper, Tami Tami Everett  metroNIDAZOLE (FLAGYL) 500 MG tablet Take 1 tablet (500 mg total) by mouth 2 (two) times daily. 11/18/19   Lamptey, Britta Mccreedy, MD  naproxen (NAPROSYN) 500 MG tablet Take 1 tablet (500 mg total) by mouth 2 (two) times daily. 01/15/18   Maczis, Elmer Sow, Tami Tami Everett  oxyCODONE-acetaminophen (PERCOCET) 7.5-325 MG tablet Take 1-2 tablets by mouth every 4 (four) hours as needed. 12/08/15   Freeman Caldron, Tami Tami Everett  traMADol (ULTRAM) 50 MG tablet Take 2 tablets (100 mg total) by mouth every 6 (six) hours. 12/08/15   Freeman Caldron, Tami Tami Everett      Allergies    Patient has no known allergies.    Review of Systems   Review of Systems  All other systems reviewed and are negative.   Physical  Exam Updated Vital Signs BP 124/78   Pulse 78   Temp 98 F (36.7 C) (Oral)   Resp 18   SpO2 100%  Physical Exam Vitals and nursing Tami Everett reviewed.  Constitutional:      General: She is not in acute distress.    Appearance: Normal appearance.  Eyes:     General: No scleral icterus.    Extraocular Movements: Extraocular movements intact.  Cardiovascular:     Rate and Rhythm: Normal rate.  Pulmonary:     Effort: Pulmonary effort is normal. No respiratory distress.  Abdominal:     Palpations: Abdomen is soft. There is no mass.     Tenderness: There is no abdominal tenderness.  Musculoskeletal:        General: Normal range of motion.     Cervical back: Neck supple.     Comments: Mild TTP noted to the right lateral malleolus with mild swelling noted to the area. Able to flex and extend against resistance.   Skin:    General: Skin is warm and dry.     Findings: No rash.  Neurological:     Mental Status: She is alert.     Sensory: Sensation is intact.     Motor: Motor function is intact.  Psychiatric:        Behavior: Behavior normal.  ED Results / Procedures / Treatments   Labs (all labs ordered are listed, but only abnormal results are displayed) Labs Reviewed - No data to display  EKG None  Radiology DG Ankle Complete Right  Result Date: 10/04/2022 CLINICAL DATA:  Twisting injury to the right ankle EXAM: RIGHT ANKLE - COMPLETE 3 VIEW COMPARISON:  Right ankle radiograph dated 06/26/2018 FINDINGS: There are no findings of fracture or dislocation. No joint effusion. Degenerative changes of the ankle. Ankle mortise is intact. Soft tissues are unremarkable. IMPRESSION: 1. No acute fracture or dislocation. 2. Degenerative changes of the ankle. Electronically Signed   By: Agustin Cree M.D.   On: 10/04/2022 14:14    Procedures Procedures    Medications Ordered in ED Medications - No data to display  ED Course/ Medical Decision Making/ Tami Everett&P Clinical Course as of 10/04/22  1433  Tue Oct 04, 2022  1226 Offered Tylenol, ibuprofen, ice, patient declined at this time and notes that she does not want any medications or ice at this time. [SB]  1428 Re-evaluated and noted improvement of symptoms with treatment regimen. Discussed discharge treatment plan. Pt agreeable at this time. Pt appears safe for discharge. [SB]    Clinical Course User Index [SB] Tami Blazejewski Tami Everett, Tami Tami Everett                                 Medical Decision Making Amount and/or Complexity of Data Reviewed Radiology: ordered.   Patient with right ankle pain onset yesterday due to cleaning, twisting mechanism. Vital signs patient afebrile. On exam, patient with Mild TTP noted to the right lateral malleolus with mild swelling noted to the area. Able to flex and extend against resistance.  Differential diagnosis includes fracture, dislocation, sprain.  Imaging: I ordered imaging studies including right ankle x-ray I independently visualized and interpreted imaging which showed:  1. No acute fracture or dislocation.  2. Degenerative changes of the ankle.   I agree with the radiologist interpretation  Disposition: Presentation suspicious for right ankle sprain.  All concerns this time for fracture or dislocation. After consideration of the diagnostic results and the patients response to treatment, I feel that the patient would benefit from Discharge home.  Ankle brace given to patient today.  Offered crutches, patient declined at this time. Work Tami Everett provided. Supportive care measures and strict return precautions discussed with patient at bedside. Pt acknowledges and verbalizes understanding. Pt appears safe for discharge. Follow up as indicated in discharge paperwork.    This chart was dictated using voice recognition software, Dragon. Despite the best efforts of this provider to proofread and correct errors, errors may still occur which can change documentation meaning.  Final Clinical Impression(s) /  ED Diagnoses Final diagnoses:  Sprain of right ankle, unspecified ligament, initial encounter    Rx / DC Orders ED Discharge Orders     None         Tami Zwick Tami Everett, Tami Tami Everett 10/04/22 1433    Gerhard Munch, MD 10/04/22 1524

## 2022-10-04 NOTE — Discharge Instructions (Signed)
It was a pleasure taking care of you!   Your x-ray was negative for fracture or dislocation.  You may take over the counter 600 mg Ibuprofen every 6 hours and alternate with 500 mg Tylenol every 6 hours as needed for pain for no more than 7 days.  You will be given a ankle brace, wear during the day you may remove it at night.  You may apply ice or heat to affected area for up to 15 minutes at a time. Ensure to place a barrier between your skin and the ice/heat.  You may follow-up with your primary care provider as needed.  Return to the Emergency Department if you are experiencing increasing/worsening pain, swelling, color change, fever, or worsening symptoms.

## 2022-10-04 NOTE — Progress Notes (Signed)
Orthopedic Tech Progress Note Patient Details:  RANELLE CHAMPLAIN 1990-06-12 [id number]  Ortho Devices Type of Ortho Device: ASO Ortho Device/Splint Location: right Ortho Device/Splint Interventions: Ordered, Application, Adjustment   Post Interventions Patient Tolerated: Well Instructions Provided: Adjustment of device, Care of device  Kizzie Fantasia 10/04/2022, 2:53 PM

## 2023-05-24 ENCOUNTER — Encounter (HOSPITAL_COMMUNITY): Payer: Self-pay

## 2023-05-24 ENCOUNTER — Other Ambulatory Visit: Payer: Self-pay

## 2023-05-24 ENCOUNTER — Emergency Department (HOSPITAL_COMMUNITY)
Admission: EM | Admit: 2023-05-24 | Discharge: 2023-05-24 | Disposition: A | Discharge status: Home / Self Care | Attending: Emergency Medicine | Admitting: Emergency Medicine

## 2023-05-24 DIAGNOSIS — M79671 Pain in right foot: Secondary | ICD-10-CM | POA: Insufficient documentation
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
## 2023-05-24 NOTE — Discharge Instructions (Addendum)
 Return if any problems.

## 2023-05-25 NOTE — ED Provider Notes (Signed)
 [place] EMERGENCY DEPARTMENT AT [facility] Provider Note   CSN: [id number] Arrival date & time: 05/24/23  1107     History  Chief Complaint  Patient presents with   Leg Pain    Tami Everett is a 33 y.o. female.  Patient complains of pain in her foot.  Patient reports that she had surgery for a fracture and that she sometimes has a flareup of pain.  Patient states she has not had any new injuries.  Patient states that it normally gets better with an Ace wrap and wrist.  Patient states she does not need any medication for pain.  Patient has not had any new injury she does not feel like she needs an x-ray.  Patient reports that she is able to walk but has difficulty.  The history is provided by the patient. No language interpreter was used.  Leg Pain      Home Medications Prior to Admission medications   Medication Sig Start Date End Date Taking? Authorizing Provider  cyclobenzaprine (FLEXERIL) 10 MG tablet Take 1 tablet (10 mg total) by mouth 2 (two) times daily as needed for muscle spasms. 06/26/22   Debbra Fairy, PA-C  diclofenac Sodium (VOLTAREN) 1 % GEL Apply 2 g topically 4 (four) times daily. 06/26/22   Debbra Fairy, PA-C  doxycycline (VIBRAMYCIN) 100 MG capsule Take 1 capsule (100 mg total) by mouth 2 (two) times daily. 11/15/19   Afton Albright, MD  ibuprofen (ADVIL) 600 MG tablet Take 1 tablet (600 mg total) by mouth every 6 (six) hours as needed. 06/26/22   Debbra Fairy, PA-C  metroNIDAZOLE (FLAGYL) 500 MG tablet Take 1 tablet (500 mg total) by mouth 2 (two) times daily. 11/18/19   Lamptey, Donley Furth, MD  naproxen (NAPROSYN) 500 MG tablet Take 1 tablet (500 mg total) by mouth 2 (two) times daily. 01/15/18   Maczis, Michael M, PA-C  oxyCODONE-acetaminophen (PERCOCET) 7.5-325 MG tablet Take 1-2 tablets by mouth every 4 (four) hours as needed. 12/08/15   Georganna Kin, PA-C  traMADol (ULTRAM) 50 MG tablet Take 2 tablets (100 mg total) by mouth every 6 (six) hours.  12/08/15   Jeffery, Michael J, PA-C      Allergies    Patient has no known allergies.    Review of Systems   Review of Systems  All other systems reviewed and are negative.   Physical Exam Updated Vital Signs BP (!) 155/95 (BP Location: Left Arm)   Pulse 73   Temp 98.3 F (36.8 C) (Oral)   Resp 16   Ht 5\' 3"  (1.6 m)   Wt 90 kg   LMP 04/23/2023 (Approximate)   SpO2 100%   BMI 35.15 kg/m  Physical Exam Vitals and nursing note reviewed.  Constitutional:      Appearance: She is well-developed.  HENT:     Head: Normocephalic.  Cardiovascular:     Rate and Rhythm: Normal rate.  Pulmonary:     Effort: Pulmonary effort is normal.  Abdominal:     General: There is no distension.  Musculoskeletal:        General: Swelling and tenderness present.     Cervical back: Normal range of motion.     Comments: Tender right foot and ankle well-healed surgical incision no deformity, neurovascular neurosensory intact.  Skin:    General: Skin is warm.  Neurological:     General: No focal deficit present.     Mental Status: She is alert and oriented to  person, place, and time.     ED Results / Procedures / Treatments   Labs (all labs ordered are listed, but only abnormal results are displayed) Labs Reviewed - No data to display  EKG None  Radiology No results found.  Procedures Procedures    Medications Ordered in ED Medications - No data to display  ED Course/ Medical Decision Making/ A&P                                 Medical Decision Making Patient complains of an exacerbation of chronic pain from a foot fracture  Amount and/or Complexity of Data Reviewed Radiology: ordered.    Details: Considered x-rays and discussed with patient patient declined x-rays.  Risk OTC drugs. Risk Details: Patient advised to continue over-the-counter medicine she is placed in an Ace wrap.  Patient is discharged in stable condition to follow-up with her physician for  recheck.           Final Clinical Impression(s) / ED Diagnoses Final diagnoses:  Foot pain, right    Rx / DC Orders ED Discharge Orders     None     An After Visit Summary was printed and given to the patient.     Sandi Crosby, PA-C 05/25/23 1734    [address] [place], [place] 05/27/23 [phone]

## 2023-11-19 ENCOUNTER — Emergency Department (HOSPITAL_COMMUNITY)
Admission: EM | Admit: 2023-11-19 | Discharge: 2023-11-19 | Disposition: A | Discharge status: Home / Self Care | Attending: Emergency Medicine | Admitting: Emergency Medicine

## 2023-11-19 ENCOUNTER — Encounter (HOSPITAL_COMMUNITY): Payer: Self-pay | Admitting: Emergency Medicine

## 2023-11-19 ENCOUNTER — Emergency Department (HOSPITAL_COMMUNITY)

## 2023-11-19 DIAGNOSIS — S93401A Sprain of unspecified ligament of right ankle, initial encounter: Secondary | ICD-10-CM | POA: Diagnosis not present

## 2023-11-19 DIAGNOSIS — X501XXA Overexertion from prolonged static or awkward postures, initial encounter: Secondary | ICD-10-CM | POA: Insufficient documentation

## 2023-11-19 DIAGNOSIS — Y9389 Activity, other specified: Secondary | ICD-10-CM | POA: Insufficient documentation

## 2023-11-19 DIAGNOSIS — M7989 Other specified soft tissue disorders: Secondary | ICD-10-CM | POA: Diagnosis not present

## 2023-11-19 DIAGNOSIS — S99911A Unspecified injury of right ankle, initial encounter: Secondary | ICD-10-CM | POA: Diagnosis present

## 2023-11-19 NOTE — ED Triage Notes (Signed)
 Pt arriving POV with right ankle pain that began yesterday while playing flag football with her kids.Pt reports increased swelling. Able to walk on affected ankle

## 2023-11-19 NOTE — Discharge Instructions (Signed)
 Rest, ice and elevate.  Weight-bear as tolerated. Motrin  and Tylenol  as needed as directed. Follow up with your orthopedic specialist if not improving.

## 2023-11-19 NOTE — ED Provider Notes (Signed)
 [place] EMERGENCY DEPARTMENT AT [facility] Provider Note   CSN: [id number] Arrival date & time: 11/19/23  1324     Patient presents with: Ankle Pain (Right)   Tami Everett is a 33 y.o. female.   33 year old female presents emergency room with complaint of right ankle pain and swelling.  Patient playing about her kids yesterday, rolled her ankle.  History of significant fracture in 2017, repaired surgically.  Was told that she should not run however was trying to step on with her kids.  Patient has been able to bear weight with discomfort since the injury.       Prior to Admission medications   Medication Sig Start Date End Date Taking? Authorizing Provider  cyclobenzaprine  (FLEXERIL ) 10 MG tablet Take 1 tablet (10 mg total) by mouth 2 (two) times daily as needed for muscle spasms. 06/26/22   Nivia Colon, PA-C  diclofenac  Sodium (VOLTAREN ) 1 % GEL Apply 2 g topically 4 (four) times daily. 06/26/22   Nivia Colon, PA-C  doxycycline  (VIBRAMYCIN ) 100 MG capsule Take 1 capsule (100 mg total) by mouth 2 (two) times daily. 11/15/19   Rolinda Rogue, MD  ibuprofen  (ADVIL ) 600 MG tablet Take 1 tablet (600 mg total) by mouth every 6 (six) hours as needed. 06/26/22   Nivia Colon, PA-C  metroNIDAZOLE  (FLAGYL ) 500 MG tablet Take 1 tablet (500 mg total) by mouth 2 (two) times daily. 11/18/19   Lamptey, Aleene KIDD, MD  naproxen  (NAPROSYN ) 500 MG tablet Take 1 tablet (500 mg total) by mouth 2 (two) times daily. 01/15/18   Maczis, Michael M, PA-C  oxyCODONE -acetaminophen  (PERCOCET) 7.5-325 MG tablet Take 1-2 tablets by mouth every 4 (four) hours as needed. 12/08/15   Juliane Ozell PARAS, PA-C  traMADol  (ULTRAM ) 50 MG tablet Take 2 tablets (100 mg total) by mouth every 6 (six) hours. 12/08/15   Juliane Ozell PARAS, PA-C    Allergies: Patient has no known allergies.    Review of Systems Negative except as per HPI Updated Vital Signs BP 131/82 (BP Location: Right Arm)   Pulse 88   Temp 98.8 F  (37.1 C) (Oral)   Resp 16   Ht 5' 4 (1.626 m)   Wt 95.3 kg   LMP 11/13/2023 (Exact Date)   SpO2 100%   BMI 36.05 kg/m   Physical Exam Vitals and nursing note reviewed.  Constitutional:      General: She is not in acute distress.    Appearance: She is well-developed. She is not diaphoretic.  HENT:     Head: Normocephalic and atraumatic.  Cardiovascular:     Pulses: Normal pulses.  Pulmonary:     Effort: Pulmonary effort is normal.  Musculoskeletal:        General: Swelling and tenderness present. No deformity.     Right knee: Normal.     Right ankle: Tenderness present over the medial malleolus. No lateral malleolus, base of 5th metatarsal or proximal fibula tenderness. Decreased range of motion.     Comments: Swelling and tenderness to the anterior as well as medial right ankle.  DP pulse present, sensation intact.  Well-healed medial ankle surgical incision.  Skin:    General: Skin is warm and dry.     Findings: No bruising, erythema or rash.  Neurological:     Mental Status: She is alert and oriented to person, place, and time.     Sensory: No sensory deficit.  Psychiatric:        Behavior: Behavior normal.     (  all labs ordered are listed, but only abnormal results are displayed) Labs Reviewed - No data to display  EKG: None  Radiology: DG Ankle Complete Right Result Date: 11/19/2023 CLINICAL DATA:  Swelling after playing flag football. EXAM: RIGHT ANKLE - COMPLETE 3+ VIEW COMPARISON:  10/04/2022. FINDINGS: There is no evidence of acute fracture or dislocation. There is a stable corticated bony fragment along the medial malleolus. Degenerative changes are present at the ankle. Degenerative changes are noted in the midfoot, ankle and hindfoot. Soft tissue swelling is present about the ankle. IMPRESSION: Degenerative changes in the midfoot, hindfoot, and ankle without evidence of acute fracture or dislocation. Electronically Signed   By: Leita Birmingham M.D.   On:  11/19/2023 14:07     Procedures   Medications Ordered in the ED - No data to display                                  Medical Decision Making Amount and/or Complexity of Data Reviewed Radiology: ordered.   33 year old female with complaint of right ankle pain after rolling her ankle playing football yesterday history of prior fracture, repaired surgically.  Found to have pain with swelling and tenderness anterior and medial right ankle.  No pain in the knee or fifth metatarsal.  X-ray of the ankle as ordered for myself are negative for acute bony abnormality, does have significant arthritic changes.  Discussed results with patient.  She is anxious to return to work, requesting ankle wrap.  Will provide ankle ASO.  Offered crutches to weight-bear as tolerated, patient declines.  She will follow-up with her orthopedic surgeon if needed if not improving.     Final diagnoses:  Sprain of right ankle, unspecified ligament, initial encounter    ED Discharge Orders     None          Beverley Leita DELENA DEVONNA 11/19/23 1512    Dasie Faden, MD 11/19/23 1642
